# Patient Record
Sex: Female | Born: 1980 | Race: Black or African American | Hispanic: No | Smoking: Light tobacco smoker
Health system: Southern US, Community
[De-identification: ages and names within clinical notes are randomized; demographics above are authoritative.]

## PROBLEM LIST (undated history)

## (undated) DIAGNOSIS — Z789 Other specified health status: Secondary | ICD-10-CM

## (undated) HISTORY — PX: EYE SURGERY: SHX253

---

## 2000-10-25 ENCOUNTER — Ambulatory Visit (HOSPITAL_COMMUNITY): Admission: RE | Admit: 2000-10-25 | Discharge: 2000-10-25 | Payer: Self-pay | Admitting: Pulmonary Disease

## 2001-02-08 ENCOUNTER — Other Ambulatory Visit: Admission: RE | Admit: 2001-02-08 | Discharge: 2001-02-08 | Payer: Self-pay | Admitting: Obstetrics and Gynecology

## 2001-12-01 ENCOUNTER — Emergency Department (HOSPITAL_COMMUNITY): Admission: EM | Admit: 2001-12-01 | Discharge: 2001-12-01 | Payer: Self-pay | Admitting: Emergency Medicine

## 2003-02-10 ENCOUNTER — Emergency Department (HOSPITAL_COMMUNITY): Admission: EM | Admit: 2003-02-10 | Discharge: 2003-02-11 | Payer: Self-pay | Admitting: Emergency Medicine

## 2006-09-09 ENCOUNTER — Encounter: Admission: RE | Admit: 2006-09-09 | Discharge: 2006-09-09 | Payer: Self-pay | Admitting: Gastroenterology

## 2006-12-05 ENCOUNTER — Ambulatory Visit (HOSPITAL_COMMUNITY): Admission: RE | Admit: 2006-12-05 | Discharge: 2006-12-05 | Payer: Self-pay | Admitting: Preventative Medicine

## 2007-06-07 ENCOUNTER — Other Ambulatory Visit: Admission: RE | Admit: 2007-06-07 | Discharge: 2007-06-07 | Payer: Self-pay | Admitting: Obstetrics and Gynecology

## 2007-07-10 ENCOUNTER — Ambulatory Visit (HOSPITAL_COMMUNITY): Admission: RE | Admit: 2007-07-10 | Discharge: 2007-07-10 | Payer: Self-pay | Admitting: Obstetrics and Gynecology

## 2010-08-30 ENCOUNTER — Encounter: Payer: Self-pay | Admitting: Emergency Medicine

## 2010-08-30 ENCOUNTER — Emergency Department (HOSPITAL_COMMUNITY)
Admission: EM | Admit: 2010-08-30 | Discharge: 2010-08-30 | Disposition: A | Discharge status: Home / Self Care | Payer: Self-pay | Attending: Emergency Medicine | Admitting: Emergency Medicine

## 2010-08-30 DIAGNOSIS — R1013 Epigastric pain: Secondary | ICD-10-CM

## 2010-08-30 DIAGNOSIS — R1012 Left upper quadrant pain: Secondary | ICD-10-CM | POA: Insufficient documentation

## 2010-08-30 LAB — URINALYSIS, ROUTINE W REFLEX MICROSCOPIC
Bilirubin Urine: NEGATIVE
Ketones, ur: NEGATIVE mg/dL
Nitrite: NEGATIVE
Urobilinogen, UA: 0.2 mg/dL (ref 0.0–1.0)

## 2010-08-30 LAB — COMPREHENSIVE METABOLIC PANEL
ALT: 11 U/L (ref 0–35)
AST: 17 U/L (ref 0–37)
BUN: 9 mg/dL (ref 6–23)
Calcium: 9.8 mg/dL (ref 8.4–10.5)
Chloride: 102 mEq/L (ref 96–112)
Creatinine, Ser: 0.61 mg/dL (ref 0.50–1.10)
GFR calc Af Amer: >60 mL/min (ref >60)
GFR calc non Af Amer: >60 mL/min (ref >60)
Total Bilirubin: 0.7 mg/dL (ref 0.3–1.2)
Total Protein: 8.7 g/dL — ABNORMAL HIGH (ref 6.0–8.3)

## 2010-08-30 LAB — POCT PREGNANCY, URINE: Preg Test, Ur: NEGATIVE

## 2010-08-30 LAB — DIFFERENTIAL
Basophils Relative: 1 % (ref 0–1)
Neutro Abs: 2.2 10*3/uL (ref 1.7–7.7)

## 2010-08-30 LAB — CBC
HCT: 40 % (ref 36.0–46.0)
Hemoglobin: 13.7 g/dL (ref 12.0–15.0)
WBC: 4.9 10*3/uL (ref 4.0–10.5)

## 2010-08-30 LAB — LIPASE, BLOOD: Lipase: 28 U/L (ref 11–59)

## 2010-08-30 MED ORDER — SODIUM CHLORIDE 0.9 % IV SOLN
INTRAVENOUS | Status: DC
Start: 2010-08-30 — End: 2010-08-30
  Administered 2010-08-30: 15:00:00 via INTRAVENOUS

## 2010-08-30 MED ORDER — FAMOTIDINE IN NACL 20-0.9 MG/50ML-% IV SOLN
20.0000 mg | Freq: Once | INTRAVENOUS | Status: AC
  Administered 2010-08-30: 20 mg via INTRAVENOUS
  Filled 2010-08-30: qty 50

## 2010-08-30 MED ORDER — GI COCKTAIL ~~LOC~~
30.0000 mL | Freq: Once | ORAL | Status: AC
  Administered 2010-08-30: 30 mL via ORAL
  Filled 2010-08-30: qty 30

## 2010-08-30 NOTE — ED Notes (Signed)
Fluid challenge per MD order.

## 2010-08-30 NOTE — ED Provider Notes (Signed)
History     CSN: [id number] Arrival date & time: 08/30/2010  1:38 PM  Chief Complaint  Patient presents with  . Abdominal Pain   HPI  Pt was seen at 1400.  Per pt, c/o gradual onset and persistence of constant LUQ abd "pain" x4 days.  Has been assoc with decreased appetite and several loose stools per day.  Denies N/V/D, no rash, no fevers, no back pain, no dysuria, no black or blood in stools, no CP/SOB, no cough.    History reviewed. No pertinent past medical history.  History reviewed. No pertinent past surgical history.  History reviewed. No pertinent family history.  History  Substance Use Topics  . Smoking status: Not on file  . Smokeless tobacco: Not on file  . Alcohol Use: Not on file    OB History    Grav Para Term Preterm Abortions TAB SAB Ect Mult Living                  Review of Systems ROS: Statement: All systems negative except as marked or noted in the HPI; Constitutional: Negative for fever and chilld. ; ; Eyes: Negative for discharge and redness. ; ; ENMT: Negative for ear pain, epistaxis, hoarseness, nasal congestion, otorrhea, rhinorrhea and sore throat. ; ; Cardiovascular: Negative for diaphoresis, dyspnea and peripheral edema. ; ; Respiratory: Negative for cough, wheezing and stridor. ; ; Gastrointestinal: +LUQ abd pain, diarrhea, decreased appetite.  Negative for nausea, vomiting, blood in stool, hematemesis, jaundice and rectal bleeding. ; ; Genitourinary: Negative for dysuria and hematuria.  Negative for flank pain ; ; Musculoskeletal: Negative for stiffness, swelling and trauma. ; ; Skin: Negative for pruritus, rash, abrasions, blisters, bruising and skin lesion. ; ; Neuro: Negative for weakness, altered level of consciousness , altered mental status, extremity weakness, involuntary movement, muscle rigidity, neck stiffness, seizure and syncope.    Physical Exam  BP 119/90  Pulse 80  Temp(Src) 98.4 F (36.9 C) (Oral)  Resp 20  Ht 5\' 3"  (1.6 m)  Wt  108 lb (48.988 kg)  BMI 19.13 kg/m2  SpO2 100%  LMP 08/24/2010  Physical Exam 1405: Physical examination:  Nursing notes reviewed; Vital signs and O2 SAT reviewed;  Constitutional: Well developed, Well nourished, Well hydrated, In no acute distress; Head:  Normocephalic, atraumatic; Eyes: EOMI, PERRL, No scleral icterus; ENMT: Mouth and pharynx normal, Mucous membranes moist; Neck: Supple, Full range of motion, No lymphadenopathy; Cardiovascular: Regular rate and rhythm, No murmur, rub, or gallop; Respiratory: Breath sounds clear & equal bilaterally, No rales, rhonchi, wheezes, or rub, Normal respiratory effort/excursion; Chest: Nontender, Movement normal; Abdomen: Soft, +tender mid-epigastric and LUQ areas to palp,  No rebound or guarding. Nondistended, Normal bowel sounds; Extremities: Pulses normal, No tenderness, No edema, No calf edema or asymmetry.; Neuro: AA&Ox3, Major CN grossly intact.  No gross focal motor or sensory deficits in extremities.; Skin: Color normal, Warm, Dry.   ED Course  Procedures  MDM MDM Reviewed: nursing note and vitals Interpretation: labs   Results for orders placed during the hospital encounter of 08/30/10  CBC      Component Value Range   WBC 4.9  4.0 - 10.5 (K/uL)   RBC 4.27  3.87 - 5.11 (MIL/uL)   Hemoglobin 13.7  12.0 - 15.0 (g/dL)   HCT 40.9  81.1 - 91.4 (%)   MCV 93.7  78.0 - 100.0 (fL)   MCH 32.1  26.0 - 34.0 (pg)   MCHC 34.3  30.0 - 36.0 (g/dL)  RDW 12.9  11.5 - 15.5 (%)   Platelets 245  150 - 400 (K/uL)  DIFFERENTIAL      Component Value Range   Neutrophils Relative 44  43 - 77 (%)   Neutro Abs 2.2  1.7 - 7.7 (K/uL)   Lymphocytes Relative 45  12 - 46 (%)   Lymphs Abs 2.2  0.7 - 4.0 (K/uL)   Monocytes Relative 8  3 - 12 (%)   Monocytes Absolute 0.4  0.1 - 1.0 (K/uL)   Eosinophils Relative 2  0 - 5 (%)   Eosinophils Absolute 0.1  0.0 - 0.7 (K/uL)   Basophils Relative 1  0 - 1 (%)   Basophils Absolute 0.0  0.0 - 0.1 (K/uL)    COMPREHENSIVE METABOLIC PANEL      Component Value Range   Sodium 138  135 - 145 (mEq/L)   Potassium 3.5  3.5 - 5.1 (mEq/L)   Chloride 102  96 - 112 (mEq/L)   CO2 22  19 - 32 (mEq/L)   Glucose, Bld 80  70 - 99 (mg/dL)   BUN 9  6 - 23 (mg/dL)   Creatinine, Ser 1.61  0.50 - 1.10 (mg/dL)   Calcium 9.8  8.4 - 09.6 (mg/dL)   Total Protein 8.7 (*) 6.0 - 8.3 (g/dL)   Albumin 4.6  3.5 - 5.2 (g/dL)   AST 17  0 - 37 (U/L)   ALT 11  0 - 35 (U/L)   Alkaline Phosphatase 58  39 - 117 (U/L)   Total Bilirubin 0.7  0.3 - 1.2 (mg/dL)   GFR calc non Af Amer >60  >60 (mL/min)   GFR calc Af Amer >60  >60 (mL/min)  LIPASE, BLOOD      Component Value Range   Lipase 28  11 - 59 (U/L)  URINALYSIS, ROUTINE W REFLEX MICROSCOPIC      Component Value Range   Color, Urine YELLOW  YELLOW    Appearance CLEAR  CLEAR    Specific Gravity, Urine 1.020  1.005 - 1.030    pH 6.0  5.0 - 8.0    Glucose, UA NEGATIVE  NEGATIVE (mg/dL)   Hgb urine dipstick NEGATIVE  NEGATIVE    Bilirubin Urine NEGATIVE  NEGATIVE    Ketones, ur NEGATIVE  NEGATIVE (mg/dL)   Protein, ur NEGATIVE  NEGATIVE (mg/dL)   Urobilinogen, UA 0.2  0.0 - 1.0 (mg/dL)   Nitrite NEGATIVE  NEGATIVE    Leukocytes, UA NEGATIVE  NEGATIVE   POCT PREGNANCY, URINE      Component Value Range   Preg Test, Ur NEGATIVE      4:17 PM:  Pt states she feels "better" after meds and wants to go home now.  Has tol PO well in ED without N/V/D.  WBC, LFT's, lipase normal.  Will have pt watch diet, take PO pepcid/maalox, f/u PMD.  Dx testing d/w pt and family.  Questions answered.  Verb understanding, agreeable to d/c home with outpt f/u.   Araiyah Cumpton Allison Quarry, DO 09/01/10 1612

## 2010-08-30 NOTE — ED Notes (Signed)
Pt complaining of abd pain since last Thursday. Reports diarrhea. Denies n/v.

## 2018-02-20 ENCOUNTER — Encounter: Payer: Self-pay | Admitting: Women's Health

## 2018-02-20 ENCOUNTER — Ambulatory Visit (INDEPENDENT_AMBULATORY_CARE_PROVIDER_SITE_OTHER): Payer: BC Managed Care – PPO | Admitting: Women's Health

## 2018-02-20 ENCOUNTER — Other Ambulatory Visit (HOSPITAL_COMMUNITY)
Admission: RE | Admit: 2018-02-20 | Discharge: 2018-02-20 | Disposition: A | Discharge status: Home / Self Care | Payer: BC Managed Care – PPO | Source: Ambulatory Visit | Attending: Obstetrics & Gynecology | Admitting: Obstetrics & Gynecology

## 2018-02-20 VITALS — BP 135/85 | HR 79 | Ht 63.0 in | Wt 119.0 lb

## 2018-02-20 DIAGNOSIS — N946 Dysmenorrhea, unspecified: Secondary | ICD-10-CM

## 2018-02-20 DIAGNOSIS — N920 Excessive and frequent menstruation with regular cycle: Secondary | ICD-10-CM | POA: Insufficient documentation

## 2018-02-20 DIAGNOSIS — Z01419 Encounter for gynecological examination (general) (routine) without abnormal findings: Secondary | ICD-10-CM | POA: Insufficient documentation

## 2018-02-20 NOTE — Patient Instructions (Addendum)
Check your blood pressure 4 times daily and keep a log, send me a picture on mychart in 1 week  Primary Care Providers  Dr. Dwana Melena Russell) [phone]  [facility] [phone]  [facility]) Dr. Karilyn Cota [phone]  [facility]) [phone]  [facility]) [phone]  [facility] [phone]

## 2018-02-20 NOTE — Progress Notes (Signed)
WELL-WOMAN EXAMINATION Patient name: Colleen Marshall MRN [id number]  Date of birth: 25-Mar-1980 Chief Complaint:   Gynecologic Exam  History of Present Illness:   Colleen Marshall is a 38 y.o. G0P0000 African American female being seen today for a routine well-woman exam.  Current complaints: heavy periods. Regular, unless has plans- then will start the day she has plans-always. Heavy for 1st 4 days, then 'manageable'. Wears pads only, changes 4-5x/day, bleeds onto clothes. Has to lay towel in bed, b/c bleeds through at night. Bad cramping. Quarter sized clots. Interested in IUD. Widow- husband killed 1 year ago. Not currently interested in group therapy, doesn't like to talk about it. Has good support system.  BP high-normal, mom/dad/older sister all have HTN.  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 The current method of family planning is abstinence Last pap >5175yrs ago w/ previous PCP. Results were: normal, never had abnormal Last mammogram: never. Results were: n/a. Family h/o breast cancer: No Last colonoscopy: never. Results were: n/a. Family h/o colorectal cancer: No Review of Systems:   Pertinent items are noted in HPI Denies any headaches, blurred vision, fatigue, shortness of breath, chest pain, abdominal pain, abnormal vaginal discharge/itching/odor/irritation, problems with periods, bowel movements, urination, or intercourse unless otherwise stated above. Pertinent History Reviewed:  Reviewed past medical,surgical, social and family history.  Reviewed problem list, medications and allergies. Physical Assessment:   Vitals:   02/20/18 0948 02/20/18 1032  BP: 139/89 135/85  Pulse: 84 79  Weight: 119 lb (54 kg)   Height: 5\' 3"  (1.6 m)   Body mass index is 21.08 kg/m.        Physical Examination:   General appearance - well appearing, and in no distress  Mental  status - alert, oriented to person, place, and time  Psych:  She has a normal mood and affect  Skin - warm and dry, normal color, no suspicious lesions noted  Chest - effort normal, all lung fields clear to auscultation bilaterally  Heart - normal rate and regular rhythm  Neck:  midline trachea, no thyromegaly or nodules  Breasts - breasts appear normal, no suspicious masses, no skin or nipple changes or  axillary nodes  Abdomen - soft, nontender, nondistended, no masses or organomegaly  Pelvic - VULVA: normal appearing vulva with no masses, tenderness or lesions  VAGINA: normal appearing vagina with normal color and discharge, no lesions  CERVIX: normal appearing cervix without discharge or lesions, no CMT  Thin prep pap is done w/ HR HPV cotesting  UTERUS: uterus is felt to be normal size, shape, consistency and nontender   ADNEXA: No adnexal masses or tenderness noted.  Extremities:  No swelling or varicosities noted  No results found for this or any previous visit (from the past 24 hour(s)).  Assessment & Plan:  1) Well-Woman Exam  2) Menorrhagia w/ dysmenorrhea> check cbc, tsh, gc/ct today-will schedule pelvic u/s after next period, then plan IUD  3) High-normal bp> strong family h/o HTN, start checking bp's QID at home, send me pic via mychart in 1wk  Labs/procedures today: pap, gc/ct, cbc, tsh  Mammogram @40yo  or sooner if problems Colonoscopy @45 -50yo or sooner if problems  Orders Placed This Encounter  Procedures  . GC/Chlamydia Probe Amp  . US PELVIS (TRANSABDOMINAL ONLY)  . US PELVIS TRANSVANGINAL NON-OB (TV ONLY)  . CBC  . TSH  Follow-up: Return for 2/24 for pelvic u/s and f/u w/ me after.  Cheral MarkerKimberly R Vickey Boak CNM, [facility]NP-BC 02/20/2018 11:30 AM

## 2018-02-21 LAB — CBC
Hematocrit: 38.4 % (ref 34.0–46.6)
Hemoglobin: 12.9 g/dL (ref 11.1–15.9)
MCH: 32.1 pg (ref 26.6–33.0)
MCHC: 33.6 g/dL (ref 31.5–35.7)
MCV: 96 fL (ref 79–97)
PLATELETS: 220 10*3/uL (ref 150–450)
RBC: 4.02 x10E6/uL (ref 3.77–5.28)
RDW: 12.8 % (ref 11.7–15.4)
WBC: 6 10*3/uL (ref 3.4–10.8)

## 2018-02-21 LAB — CYTOLOGY - PAP
DIAGNOSIS: NEGATIVE
HPV: NOT DETECTED

## 2018-02-21 LAB — TSH: TSH: 1.39 u[IU]/mL (ref 0.450–4.500)

## 2018-02-23 LAB — GC/CHLAMYDIA PROBE AMP
Chlamydia trachomatis, NAA: NEGATIVE
NEISSERIA GONORRHOEAE BY PCR: NEGATIVE

## 2018-03-14 ENCOUNTER — Ambulatory Visit (INDEPENDENT_AMBULATORY_CARE_PROVIDER_SITE_OTHER): Payer: BC Managed Care – PPO

## 2018-03-14 ENCOUNTER — Telehealth: Payer: Self-pay | Admitting: Women's Health

## 2018-03-14 ENCOUNTER — Ambulatory Visit: Payer: BC Managed Care – PPO | Admitting: Women's Health

## 2018-03-14 DIAGNOSIS — N920 Excessive and frequent menstruation with regular cycle: Secondary | ICD-10-CM

## 2018-03-14 DIAGNOSIS — N946 Dysmenorrhea, unspecified: Secondary | ICD-10-CM

## 2018-03-14 MED ORDER — MISOPROSTOL 200 MCG PO TABS: 400.0000 ug | ORAL_TABLET | Freq: Once | ORAL | 0 refills | Status: DC

## 2018-03-14 NOTE — Progress Notes (Signed)
PELVIC US TA/TV:homogeneous anteverted uterus,wnl,EEC 10.8 mm,normal ovaries bilat,ovaries appear mobile,no pain during ultrasound,no free fluid

## 2018-03-14 NOTE — Telephone Encounter (Signed)
Notified pt of normal pelvic u/s today. Wants to proceed w/ IUD insertion. Not sexually active. Wants appt asap, is tired of dealing w/ heavy periods. Rx cytotec po, take 2hrs before appt, switched to front to schedule IUD insertion.  Cheral Marker, CNM, [facility] 03/14/2018 2:29 PM

## 2018-03-27 ENCOUNTER — Ambulatory Visit: Payer: BC Managed Care – PPO | Admitting: Women's Health

## 2018-03-27 ENCOUNTER — Encounter: Payer: Self-pay | Admitting: Women's Health

## 2018-03-27 VITALS — BP 123/84 | HR 84 | Ht 63.0 in | Wt 119.6 lb

## 2018-03-27 DIAGNOSIS — Z30430 Encounter for insertion of intrauterine contraceptive device: Secondary | ICD-10-CM | POA: Diagnosis not present

## 2018-03-27 DIAGNOSIS — Z3202 Encounter for pregnancy test, result negative: Secondary | ICD-10-CM

## 2018-03-27 MED ORDER — LEVONORGESTREL 19.5 MCG/DAY IU IUD
INTRAUTERINE_SYSTEM | Freq: Once | INTRAUTERINE | Status: AC
  Administered 2018-03-27: 15:00:00 via INTRAUTERINE

## 2018-03-27 NOTE — Patient Instructions (Signed)
 Nothing in vagina for 3 days (no sex, douching, tampons, etc...)  Check your strings once a month to make sure you can feel them, if you are not able to please let us know  If you develop a fever of 100.4 or more in the next few weeks, or if you develop severe abdominal pain, please let us know  Use a backup method of birth control, such as condoms, for 2 weeks    Intrauterine Device Insertion, Care After  This sheet gives you information about how to care for yourself after your procedure. Your health care provider may also give you more specific instructions. If you have problems or questions, contact your health care provider. What can I expect after the procedure? After the procedure, it is common to have:  Cramps and pain in the abdomen.  Light bleeding (spotting) or heavier bleeding that is like your menstrual period. This may last for up to a few days.  Lower back pain.  Dizziness.  Headaches.  Nausea. Follow these instructions at home:  Before resuming sexual activity, check to make sure that you can feel the IUD string(s). You should be able to feel the end of the string(s) below the opening of your cervix. If your IUD string is in place, you may resume sexual activity. ? If you had a hormonal IUD inserted more than 7 days after your most recent period started, you will need to use a backup method of birth control for 7 days after IUD insertion. Ask your health care provider whether this applies to you.  Continue to check that the IUD is still in place by feeling for the string(s) after every menstrual period, or once a month.  Take over-the-counter and prescription medicines only as told by your health care provider.  Do not drive or use heavy machinery while taking prescription pain medicine.  Keep all follow-up visits as told by your health care provider. This is important. Contact a health care provider if:  You have bleeding that is heavier or lasts longer than  a normal menstrual cycle.  You have a fever.  You have cramps or abdominal pain that get worse or do not get better with medicine.  You develop abdominal pain that is new or is not in the same area of earlier cramping and pain.  You feel lightheaded or weak.  You have abnormal or bad-smelling discharge from your vagina.  You have pain during sexual activity.  You have any of the following problems with your IUD string(s): ? The string bothers or hurts you or your sexual partner. ? You cannot feel the string. ? The string has gotten longer.  You can feel the IUD in your vagina.  You think you may be pregnant, or you miss your menstrual period.  You think you may have an STI (sexually transmitted infection). Get help right away if:  You have flu-like symptoms.  You have a fever and chills.  You can feel that your IUD has slipped out of place. Summary  After the procedure, it is common to have cramps and pain in the abdomen. It is also common to have light bleeding (spotting) or heavier bleeding that is like your menstrual period.  Continue to check that the IUD is still in place by feeling for the string(s) after every menstrual period, or once a month.  Keep all follow-up visits as told by your health care provider. This is important.  Contact your health care provider if   you have problems with your IUD string(s), such as the string getting longer or bothering you or your sexual partner. This information is not intended to replace advice given to you by your health care provider. Make sure you discuss any questions you have with your health care provider. Document Released: 09/02/2010 Document Revised: 11/26/2015 Document Reviewed: 11/26/2015 Elsevier Interactive Patient Education  2019 Elsevier Inc.  Levonorgestrel intrauterine device (IUD) What is this medicine? LEVONORGESTREL IUD (LEE voe nor jes trel) is a contraceptive (birth control) device. The device is placed  inside the uterus by a healthcare professional. It is used to prevent pregnancy. This device can also be used to treat heavy bleeding that occurs during your period. This medicine may be used for other purposes; ask your health care provider or pharmacist if you have questions. COMMON BRAND NAME(S): Kyleena, LILETTA, Mirena, Skyla What should I tell my health care provider before I take this medicine? They need to know if you have any of these conditions: -abnormal Pap smear -cancer of the breast, uterus, or cervix -diabetes -endometritis -genital or pelvic infection now or in the past -have more than one sexual partner or your partner has more than one partner -heart disease -history of an ectopic or tubal pregnancy -immune system problems -IUD in place -liver disease or tumor -problems with blood clots or take blood-thinners -seizures -use intravenous drugs -uterus of unusual shape -vaginal bleeding that has not been explained -an unusual or allergic reaction to levonorgestrel, other hormones, silicone, or polyethylene, medicines, foods, dyes, or preservatives -pregnant or trying to get pregnant -breast-feeding How should I use this medicine? This device is placed inside the uterus by a health care professional. Talk to your pediatrician regarding the use of this medicine in children. Special care may be needed. Overdosage: If you think you have taken too much of this medicine contact a poison control center or emergency room at once. NOTE: This medicine is only for you. Do not share this medicine with others. What if I miss a dose? This does not apply. Depending on the brand of device you have inserted, the device will need to be replaced every 3 to 5 years if you wish to continue using this type of birth control. What may interact with this medicine? Do not take this medicine with any of the following medications: -amprenavir -bosentan -fosamprenavir This medicine may also  interact with the following medications: -aprepitant -armodafinil -barbiturate medicines for inducing sleep or treating seizures -bexarotene -boceprevir -griseofulvin -medicines to treat seizures like carbamazepine, ethotoin, felbamate, oxcarbazepine, phenytoin, topiramate -modafinil -pioglitazone -rifabutin -rifampin -rifapentine -some medicines to treat HIV infection like atazanavir, efavirenz, indinavir, lopinavir, nelfinavir, tipranavir, ritonavir -St. John's wort -warfarin This list may not describe all possible interactions. Give your health care provider a list of all the medicines, herbs, non-prescription drugs, or dietary supplements you use. Also tell them if you smoke, drink alcohol, or use illegal drugs. Some items may interact with your medicine. What should I watch for while using this medicine? Visit your doctor or health care professional for regular check ups. See your doctor if you or your partner has sexual contact with others, becomes HIV positive, or gets a sexual transmitted disease. This product does not protect you against HIV infection (AIDS) or other sexually transmitted diseases. You can check the placement of the IUD yourself by reaching up to the top of your vagina with clean fingers to feel the threads. Do not pull on the threads. It is a good habit   to check placement after each menstrual period. Call your doctor right away if you feel more of the IUD than just the threads or if you cannot feel the threads at all. The IUD may come out by itself. You may become pregnant if the device comes out. If you notice that the IUD has come out use a backup birth control method like condoms and call your health care provider. Using tampons will not change the position of the IUD and are okay to use during your period. This IUD can be safely scanned with magnetic resonance imaging (MRI) only under specific conditions. Before you have an MRI, tell your healthcare provider that  you have an IUD in place, and which type of IUD you have in place. What side effects may I notice from receiving this medicine? Side effects that you should report to your doctor or health care professional as soon as possible: -allergic reactions like skin rash, itching or hives, swelling of the face, lips, or tongue -fever, flu-like symptoms -genital sores -high blood pressure -no menstrual period for 6 weeks during use -pain, swelling, warmth in the leg -pelvic pain or tenderness -severe or sudden headache -signs of pregnancy -stomach cramping -sudden shortness of breath -trouble with balance, talking, or walking -unusual vaginal bleeding, discharge -yellowing of the eyes or skin Side effects that usually do not require medical attention (report to your doctor or health care professional if they continue or are bothersome): -acne -breast pain -change in sex drive or performance -changes in weight -cramping, dizziness, or faintness while the device is being inserted -headache -irregular menstrual bleeding within first 3 to 6 months of use -nausea This list may not describe all possible side effects. Call your doctor for medical advice about side effects. You may report side effects to FDA at 1-800-FDA-1088. Where should I keep my medicine? This does not apply. NOTE: This sheet is a summary. It may not cover all possible information. If you have questions about this medicine, talk to your doctor, pharmacist, or health care provider.  2019 Elsevier/Gold Standard (2015-10-17 14:14:56)  

## 2018-03-27 NOTE — Progress Notes (Signed)
   IUD INSERTION Patient name: Colleen Marshall MRN [id number]  Date of birth: 05/20/1980 Subjective Findings:   [name] is a 38 y.o. G0P0000 African American female being seen today for insertion of a Liletta IUD d/t menorrhagia w/ mostly regular periods, and dysmenorrhea. Had normal pelvic u/s 03/14/18. Pap, TSH, gc/ct all neg 02/20/18.  Took cytotec few hours ago, some cramping.   Patient's last menstrual period was 03/02/2018. Last sexual intercourse was >2wks ago, currently not sexually active Last pap 02/20/18. Results were:  neg w/ -HRHPV  The risks and benefits of the method and placement have been thouroughly reviewed with the patient and all questions were answered.  Specifically the patient is aware of failure rate of 01/998, expulsion of the IUD and of possible perforation.  The patient is aware of irregular bleeding due to the method and understands the incidence of irregular bleeding diminishes with time.  Signed copy of informed consent in chart.  Pertinent History Reviewed:   Reviewed past medical,surgical, social, obstetrical and family history.  Reviewed problem list, medications and allergies. Objective Findings & Procedure:   Vitals:   03/27/18 1415  BP: 123/84  Pulse: 84  Weight: 119 lb 9.6 oz (54.3 kg)  Height: 5\' 3"  (1.6 m)  Body mass index is 21.19 kg/m.  No results found for this or any previous visit (from the past 24 hour(s)).   Time out was performed.  A graves speculum was placed in the vagina.  The cervix was visualized, prepped using Betadine, and grasped with a single tooth tenaculum. The uterus was found to be anteroflexed and it sounded to 7 cm.  Liletta IUD placed per manufacturer's recommendations. The strings were trimmed to approximately 3 cm. The patient tolerated the procedure well, although very crampy.  Informal transvaginal sonogram was performed and the proper placement of the IUD was verified. Assessment & Plan:   1) Liletta IUD  insertion The patient was given post procedure instructions, including signs and symptoms of infection and to check for the strings after each menses or each month, and refraining from intercourse or anything in the vagina for 3 days. She was given a Liletta care card with date IUD placed, and date IUD to be removed. She is scheduled for a f/u appointment in 4 weeks.  Orders Placed This Encounter  Procedures  . POCT urine pregnancy    Return in about 4 weeks (around 04/24/2018) for F/U.  Cheral Marker CNM, [facility] 03/27/2018 3:09 PM

## 2018-03-28 ENCOUNTER — Other Ambulatory Visit: Payer: Self-pay | Admitting: Women's Health

## 2018-03-28 MED ORDER — PROMETHAZINE HCL 25 MG PO TABS: 12.5000 mg | ORAL_TABLET | Freq: Four times a day (QID) | ORAL | 0 refills | Status: DC | PRN

## 2018-03-28 NOTE — Progress Notes (Signed)
Called pt to check on her, as she was having bad cramps after IUD insertion yesterday, she states cramps got much worse last night, took tylenol which helped, cramps better today, but nauseated. Offered phenergan rx, which she wants, so rx sent. Keep f/u as scheduled, let me know if anything worsens.  Cheral Marker, CNM, [facility] 03/28/2018 1:44 PM

## 2018-03-31 ENCOUNTER — Telehealth: Payer: Self-pay | Admitting: Women's Health

## 2018-03-31 MED ORDER — PROMETHAZINE HCL 25 MG PO TABS: 12.5000 mg | ORAL_TABLET | Freq: Four times a day (QID) | ORAL | 0 refills | Status: DC | PRN

## 2018-03-31 NOTE — Telephone Encounter (Signed)
Please call pt having cramping and nauesa had IUD placement on Monday/ [facility] is not a [employer] employee

## 2018-03-31 NOTE — Addendum Note (Signed)
Addended by: Sherre Lain A on: 03/31/2018 01:35 PM   Modules accepted: Orders

## 2018-04-03 ENCOUNTER — Other Ambulatory Visit: Payer: Self-pay | Admitting: Women's Health

## 2018-04-03 ENCOUNTER — Telehealth: Payer: Self-pay | Admitting: Women's Health

## 2018-04-03 MED ORDER — TRAMADOL HCL 50 MG PO TABS: 50.0000 mg | ORAL_TABLET | Freq: Two times a day (BID) | ORAL | 0 refills | Status: DC | PRN

## 2018-04-03 MED ORDER — MEGESTROL ACETATE 40 MG PO TABS: ORAL_TABLET | ORAL | 1 refills | Status: DC

## 2018-04-03 NOTE — Telephone Encounter (Signed)
Needs for Colleen Marshall to give her a call today with situation that happened this weeked.  Please call pt

## 2018-04-03 NOTE — Telephone Encounter (Signed)
Returned pt's call. Was unable to get phenergan for nausea, it was sent to pharmacy in [place]. Nausea improved. Period started and is very heavy, cramps have increased. Will rx megace to try to help stop bleeding. Pt reports ibuprofen/apap/nsaids make bleeding heavier, so can't take for cramps. Has been taking asa daily, which she feels has made it worse. Feels she definitely needs something for cramps. Can't do toradol as it's an NSAID. Will rx tramadol #15. Cheral Marker, CNM, WHNP-BC 04/03/2018 11:10 AM

## 2018-04-20 ENCOUNTER — Encounter: Payer: Self-pay | Admitting: *Deleted

## 2018-04-24 ENCOUNTER — Ambulatory Visit: Payer: BC Managed Care – PPO | Admitting: Women's Health

## 2018-04-24 ENCOUNTER — Other Ambulatory Visit: Payer: Self-pay

## 2018-05-01 ENCOUNTER — Other Ambulatory Visit: Payer: Self-pay | Admitting: Women's Health

## 2018-05-01 MED ORDER — TRAMADOL HCL 50 MG PO TABS: 50.0000 mg | ORAL_TABLET | Freq: Two times a day (BID) | ORAL | 0 refills | Status: DC | PRN

## 2018-05-05 ENCOUNTER — Other Ambulatory Visit: Payer: Self-pay | Admitting: Women's Health

## 2018-05-19 ENCOUNTER — Encounter: Payer: Self-pay | Admitting: *Deleted

## 2018-05-22 ENCOUNTER — Ambulatory Visit (INDEPENDENT_AMBULATORY_CARE_PROVIDER_SITE_OTHER): Payer: BC Managed Care – PPO | Admitting: Advanced Practice Midwife

## 2018-05-22 ENCOUNTER — Other Ambulatory Visit: Payer: Self-pay

## 2018-05-22 ENCOUNTER — Encounter: Payer: Self-pay | Admitting: Advanced Practice Midwife

## 2018-05-22 VITALS — BP 137/88 | HR 81 | Ht 63.0 in | Wt 121.5 lb

## 2018-05-22 DIAGNOSIS — Z30431 Encounter for routine checking of intrauterine contraceptive device: Secondary | ICD-10-CM

## 2018-05-22 MED ORDER — MEGESTROL ACETATE 40 MG PO TABS: ORAL_TABLET | ORAL | 2 refills | Status: DC

## 2018-05-22 NOTE — Progress Notes (Signed)
History:  38 y.o. G0. A lyletta IUD was placed  03/27/18 to help w/bleeding.  She is rather annoyed at the SE of bleeding, has been taking megace.  Megace slows it down, but hasn't completely stopped.    The following portions of the patient's history were reviewed and updated as appropriate: allergies, current medications, past family history, past medical history, past social history, past surgical history and problem list.  Review of Systems:   Constitutional: Negative for fever and chills Eyes: Negative for visual disturbances Respiratory: Negative for shortness of breath, dyspnea Cardiovascular: Negative for chest pain or palpitations  Gastrointestinal: Negative for vomiting, diarrhea and constipation Genitourinary: Negative for dysuria and urgency Musculoskeletal: Negative for back pain, joint pain, myalgias  Neurological: Negative for dizziness and headaches    Objective:  Physical Exam Blood pressure 137/88, pulse 81, height 5\' 3"  (1.6 m), weight 121 lb 8 oz (55.1 kg). Gen: NAD Abd: Soft, nontender and nondistended Pelvic: Bedside US reveals properly place IUD.   Assessment & Plan:  Normal IUD check. BTB SE bothersome to pt:  Take megace 120mg /day until bleeding has stopped for 5-7days, then try to taper down to 40mg /day.  If this isn't possible, it is OK to continue to take 80-120mg /day if needed.  Stay on meds for at least the next 2 months.   If bleeding doesn't stop in one week, call for advice (will make dosage decisions based on what bleeding pattern is like, hopeful that megace will work). If not, could consider estrogen instead.

## 2018-05-23 ENCOUNTER — Telehealth: Payer: Self-pay | Admitting: Women's Health

## 2018-05-23 NOTE — Telephone Encounter (Signed)
Patient was seen yesterday by Drenda Freeze, per the patient, she increased her medication and the pharmacy will not fill it.  [facility]  [phone]

## 2018-05-23 NOTE — Telephone Encounter (Signed)
Spoke to pharmacy who states insurance will not pay for refill until 5/10.  Patient states she does not think she enough to take 3 daily until then so she will cut back to 1 daily until then.

## 2018-06-29 ENCOUNTER — Other Ambulatory Visit: Payer: Self-pay | Admitting: Women's Health

## 2018-06-29 MED ORDER — MEGESTROL ACETATE 40 MG PO TABS: 40.0000 mg | ORAL_TABLET | Freq: Three times a day (TID) | ORAL | 1 refills | Status: DC

## 2018-08-17 ENCOUNTER — Other Ambulatory Visit: Payer: Self-pay

## 2018-08-17 DIAGNOSIS — Z20822 Contact with and (suspected) exposure to covid-19: Secondary | ICD-10-CM

## 2018-08-20 LAB — NOVEL CORONAVIRUS, NAA: SARS-CoV-2, NAA: NOT DETECTED

## 2019-03-25 ENCOUNTER — Ambulatory Visit: Payer: BC Managed Care – PPO | Attending: Internal Medicine

## 2019-03-25 DIAGNOSIS — Z23 Encounter for immunization: Secondary | ICD-10-CM | POA: Insufficient documentation

## 2019-03-25 NOTE — Progress Notes (Signed)
   Covid-19 Vaccination Clinic  Name:  Colleen Marshall    MRN: [id number] DOB: 11-Jan-1981  03/25/2019  [name] was observed post Covid-19 immunization for 15 minutes without incident. She was provided with Vaccine Information Sheet and instruction to access the V-Safe system.   [name] was instructed to call 911 with any severe reactions post vaccine: Marland Kitchen Difficulty breathing  . Swelling of face and throat  . A fast heartbeat  . A bad rash all over body  . Dizziness and weakness   Immunizations Administered    Name Date Dose VIS Date Route   Pfizer COVID-19 Vaccine 03/25/2019  1:54 PM 0.3 mL 12/29/2018 Intramuscular   Manufacturer: [employer], [employer]   Lot: [id number]   NDC: [address]-7

## 2019-04-15 ENCOUNTER — Ambulatory Visit: Payer: BC Managed Care – PPO | Attending: Internal Medicine

## 2019-04-15 ENCOUNTER — Other Ambulatory Visit: Payer: Self-pay

## 2019-04-15 DIAGNOSIS — Z23 Encounter for immunization: Secondary | ICD-10-CM

## 2019-04-15 NOTE — Progress Notes (Signed)
   Covid-19 Vaccination Clinic  Name:  Colleen Marshall    MRN: [id number] DOB: Dec 26, 1980  04/15/2019  [name] was observed post Covid-19 immunization for 15 minutes without incident. She was provided with Vaccine Information Sheet and instruction to access the V-Safe system.   [name] was instructed to call 911 with any severe reactions post vaccine: Marland Kitchen Difficulty breathing  . Swelling of face and throat  . A fast heartbeat  . A bad rash all over body  . Dizziness and weakness   Immunizations Administered    Name Date Dose VIS Date Route   Pfizer COVID-19 Vaccine 04/15/2019 11:49 AM 0.3 mL 12/29/2018 Intramuscular   Manufacturer: [employer], [employer]   Lot: [id number]   NDC: [address]-6

## 2020-03-10 ENCOUNTER — Ambulatory Visit: Payer: BC Managed Care – PPO | Admitting: Advanced Practice Midwife

## 2020-03-10 ENCOUNTER — Other Ambulatory Visit (HOSPITAL_COMMUNITY)
Admission: RE | Admit: 2020-03-10 | Discharge: 2020-03-10 | Disposition: A | Discharge status: Home / Self Care | Payer: BC Managed Care – PPO | Source: Ambulatory Visit | Attending: Advanced Practice Midwife | Admitting: Advanced Practice Midwife

## 2020-03-10 ENCOUNTER — Other Ambulatory Visit: Payer: Self-pay

## 2020-03-10 VITALS — BP 143/86 | HR 84 | Wt 122.0 lb

## 2020-03-10 DIAGNOSIS — N921 Excessive and frequent menstruation with irregular cycle: Secondary | ICD-10-CM | POA: Diagnosis not present

## 2020-03-10 DIAGNOSIS — Z975 Presence of (intrauterine) contraceptive device: Secondary | ICD-10-CM

## 2020-03-10 DIAGNOSIS — N939 Abnormal uterine and vaginal bleeding, unspecified: Secondary | ICD-10-CM | POA: Insufficient documentation

## 2020-03-10 DIAGNOSIS — Z30431 Encounter for routine checking of intrauterine contraceptive device: Secondary | ICD-10-CM | POA: Diagnosis not present

## 2020-03-10 MED ORDER — MEGESTROL ACETATE 40 MG PO TABS: ORAL_TABLET | ORAL | 11 refills | Status: DC

## 2020-03-10 NOTE — Progress Notes (Signed)
History:  40 y.o. G0P0000 here today for today for IUD string check; Liletta IUD was placed  2 years ago  Still bleeds irregularly, megace works while she is taking it. Starting a new relationship, doesn't want to bleed during ntercourse. IUD helps w/menorrhagia, which is why she got it.  .  The following portions of the patient's history were reviewed and updated as appropriate: allergies, current medications, past family history, past medical history, past social history, past surgical history and problem list.  Review of Systems:   Constitutional: Negative for fever and chills Eyes: Negative for visual disturbances Respiratory: Negative for shortness of breath, dyspnea Cardiovascular: Negative for chest pain or palpitations  Gastrointestinal: Negative for vomiting, diarrhea and constipation Genitourinary: Negative for dysuria and urgency Musculoskeletal: Negative for back pain, joint pain, myalgias  Neurological: Negative for dizziness and headaches    Objective:  Physical Exam Blood pressure (!) 143/86, pulse 84, weight 122 lb (55.3 kg), last menstrual period 02/18/2020. Gen: NAD Abd: Soft, nontender and nondistended Pelvic: Bedside US reveals properly place IUD. Normal appearing d/c w/o odor, nuswab collected. No sex for >3 years, but wants std screening d/t getting ready to start a new relationship   Assessment & Plan:  BTB d/t Liletta Options:  DC IUD, do endo ablation or other form of BC for dysmenorrhea Change IUD to Paragard, add lysteda for pain Daily megace.   Chooses daily megace for now.

## 2020-03-12 LAB — CERVICOVAGINAL ANCILLARY ONLY
Bacterial Vaginitis (gardnerella): POSITIVE — AB
Candida Glabrata: NEGATIVE
Candida Vaginitis: NEGATIVE
Chlamydia: NEGATIVE
Comment: NEGATIVE
Comment: NEGATIVE
Comment: NEGATIVE
Comment: NEGATIVE
Comment: NEGATIVE
Comment: NORMAL
Neisseria Gonorrhea: NEGATIVE
Trichomonas: NEGATIVE

## 2020-03-13 ENCOUNTER — Other Ambulatory Visit: Payer: Self-pay | Admitting: Advanced Practice Midwife

## 2020-03-13 MED ORDER — METRONIDAZOLE 500 MG PO TABS: 500.0000 mg | ORAL_TABLET | Freq: Two times a day (BID) | ORAL | 0 refills | Status: DC

## 2020-03-13 NOTE — Progress Notes (Signed)
Flagyl for BV 

## 2021-03-16 ENCOUNTER — Ambulatory Visit (INDEPENDENT_AMBULATORY_CARE_PROVIDER_SITE_OTHER): Payer: BC Managed Care – PPO | Admitting: Women's Health

## 2021-03-16 ENCOUNTER — Encounter: Payer: Self-pay | Admitting: Women's Health

## 2021-03-16 ENCOUNTER — Other Ambulatory Visit (HOSPITAL_COMMUNITY)
Admission: RE | Admit: 2021-03-16 | Discharge: 2021-03-16 | Disposition: A | Discharge status: Home / Self Care | Payer: BC Managed Care – PPO | Source: Ambulatory Visit | Attending: Women's Health | Admitting: Women's Health

## 2021-03-16 ENCOUNTER — Other Ambulatory Visit: Payer: Self-pay

## 2021-03-16 VITALS — BP 137/87 | HR 77 | Ht 63.0 in | Wt 118.5 lb

## 2021-03-16 DIAGNOSIS — N926 Irregular menstruation, unspecified: Secondary | ICD-10-CM

## 2021-03-16 DIAGNOSIS — Z1231 Encounter for screening mammogram for malignant neoplasm of breast: Secondary | ICD-10-CM

## 2021-03-16 DIAGNOSIS — Z1321 Encounter for screening for nutritional disorder: Secondary | ICD-10-CM

## 2021-03-16 DIAGNOSIS — Z1329 Encounter for screening for other suspected endocrine disorder: Secondary | ICD-10-CM | POA: Diagnosis not present

## 2021-03-16 DIAGNOSIS — Z01419 Encounter for gynecological examination (general) (routine) without abnormal findings: Secondary | ICD-10-CM | POA: Diagnosis not present

## 2021-03-16 DIAGNOSIS — R102 Pelvic and perineal pain: Secondary | ICD-10-CM

## 2021-03-16 DIAGNOSIS — Z131 Encounter for screening for diabetes mellitus: Secondary | ICD-10-CM

## 2021-03-16 DIAGNOSIS — Z113 Encounter for screening for infections with a predominantly sexual mode of transmission: Secondary | ICD-10-CM

## 2021-03-16 DIAGNOSIS — N946 Dysmenorrhea, unspecified: Secondary | ICD-10-CM

## 2021-03-16 NOTE — Patient Instructions (Signed)
Call [phone] to schedule your mammogram 

## 2021-03-16 NOTE — Progress Notes (Signed)
WELL-WOMAN EXAMINATION Patient name: Colleen Marshall MRN [id number]  Date of birth: Oct 10, 1980 Chief Complaint:   Gynecologic Exam  History of Present Illness:   Colleen Marshall is a 41 y.o. G0P0000 African-American female being seen today for a routine well-woman exam. Had Liletta IUD placed 03/27/18 for menorrhagia w/ regular cycle. Was having bothersome breakthrough bleeding at IUD f/u, so was rx'd megace, states it didn't help. And she read that it is used in HIV and cancer pt's and no longer wanted to take it. Currently having irregular but lighter periods, however they last longer ~7-14d, changes pad q 3-4hrs, +cramping/pain.  Is done with it, and doesn't want to bleed anymore. Has a new boyfriend and is going out of town in April, doesn't want to be bleeding when she goes. Some RLQ pain at times.   PCP: none      does desire labs including STI screening Patient's last menstrual period was 03/05/2021. The current method of family planning is IUD. Liletta inserted 03/27/18 Last pap 02/20/18. Results were: NILM w/ HRHPV negative. H/O abnormal pap: no Last mammogram: never. Results were: N/A. Family h/o breast cancer: yes MA Last colonoscopy: never. Results were: N/A. Family h/o colorectal cancer: no  Depression screen [facility] 2/9 03/16/2021 02/20/2018  Decreased Interest 0 0  Down, Depressed, Hopeless 0 0  PHQ - 2 Score 0 0  Altered sleeping 1 -  Tired, decreased energy 1 -  Change in appetite 0 -  Feeling bad or failure about yourself  0 -  Trouble concentrating 0 -  Moving slowly or fidgety/restless 0 -  Suicidal thoughts 0 -  PHQ-9 Score 2 -     GAD 7 : Generalized Anxiety Score 03/16/2021  Nervous, Anxious, on Edge 0  Control/stop worrying 0  Worry too much - different things 1  Trouble relaxing 0  Restless 0  Easily annoyed or irritable 0  Afraid - awful might happen 0  Total GAD 7 Score 1     Review of Systems:   Pertinent items are noted in HPI Denies any headaches, blurred  vision, fatigue, shortness of breath, chest pain, abdominal pain, abnormal vaginal discharge/itching/odor/irritation, problems with periods, bowel movements, urination, or intercourse unless otherwise stated above. Pertinent History Reviewed:  Reviewed past medical,surgical, social and family history.  Reviewed problem list, medications and allergies. Physical Assessment:   Vitals:   03/16/21 1333  BP: 137/87  Pulse: 77  Weight: 118 lb 8 oz (53.8 kg)  Height: 5\' 3"  (1.6 m)  Body mass index is 20.99 kg/m.        Physical Examination:   General appearance - well appearing, and in no distress  Mental status - alert, oriented to person, place, and time  Psych:  She has a normal mood and affect  Skin - warm and dry, normal color, no suspicious lesions noted  Chest - effort normal, all lung fields clear to auscultation bilaterally  Heart - normal rate and regular rhythm  Neck:  midline trachea, no thyromegaly or nodules  Breasts - breasts appear normal, no suspicious masses, no skin or nipple changes or  axillary nodes  Abdomen - soft, nontender, nondistended, no masses or organomegaly  Pelvic - VULVA: normal appearing vulva with no masses, tenderness or lesions  VAGINA: normal appearing vagina with normal color and discharge, no lesions  CERVIX: normal appearing cervix without discharge or lesions, no CMT[, IUD strings visible and appropriate length  Thin prep pap is done w/ HR HPV cotesting  UTERUS: uterus is felt to be normal size, shape, consistency and nontender   ADNEXA: No adnexal masses noted, some RLQ tenderness  Extremities:  No swelling or varicosities noted  Chaperone: Malachy Mood    No results found for this or any previous visit (from the past 24 hour(s)).  Assessment & Plan:  1) Well-Woman Exam  2) Irregular bleeding and dysmenorrhea w/ Liletta IUD> wants bleeding to stop completely. Megace didn't help in past and was not comfortable taking it. Will get pelvic u/s and  f/u w/ MD after  3) STD screen  Labs/procedures today: pap and labs  Mammogram: screening order placed, number given for pt to call and schedule appt Colonoscopy: @ 42yo, or sooner if problems  Orders Placed This Encounter  Procedures   MM 3D SCREEN BREAST BILATERAL   US PELVIS (TRANSABDOMINAL ONLY)   US PELVIS TRANSVAGINAL NON-OB (TV ONLY)   CBC   Comprehensive metabolic panel   TSH   Hemoglobin A1c   VITAMIN D 25 Hydroxy (Vit-D Deficiency, Fractures)   HIV Antibody (routine testing w rflx)   RPR    Meds: No orders of the defined types were placed in this encounter.   Follow-up: Return for 1st available, US:GYN and f/u w/ MD after.  Cheral Marker CNM, [facility] 03/16/2021 2:27 PM

## 2021-03-17 LAB — COMPREHENSIVE METABOLIC PANEL
ALT: 12 IU/L (ref 0–32)
AST: 21 IU/L (ref 0–40)
Albumin/Globulin Ratio: 1.7 (ref 1.2–2.2)
Albumin: 4.5 g/dL (ref 3.8–4.8)
Alkaline Phosphatase: 72 IU/L (ref 44–121)
BUN/Creatinine Ratio: 9 (ref 9–23)
BUN: 7 mg/dL (ref 6–24)
Bilirubin Total: 0.3 mg/dL (ref 0.0–1.2)
CO2: 24 mmol/L (ref 20–29)
Calcium: 9.4 mg/dL (ref 8.7–10.2)
Chloride: 103 mmol/L (ref 96–106)
Creatinine, Ser: 0.78 mg/dL (ref 0.57–1.00)
Globulin, Total: 2.7 g/dL (ref 1.5–4.5)
Glucose: 83 mg/dL (ref 70–99)
Potassium: 3.9 mmol/L (ref 3.5–5.2)
Sodium: 141 mmol/L (ref 134–144)
Total Protein: 7.2 g/dL (ref 6.0–8.5)
eGFR: 98 mL/min/{1.73_m2} (ref >59)

## 2021-03-17 LAB — CBC
Hematocrit: 40.3 % (ref 34.0–46.6)
Hemoglobin: 13.9 g/dL (ref 11.1–15.9)
MCH: 33.5 pg — ABNORMAL HIGH (ref 26.6–33.0)
MCHC: 34.5 g/dL (ref 31.5–35.7)
MCV: 97 fL (ref 79–97)
Platelets: 231 10*3/uL (ref 150–450)
RBC: 4.15 x10E6/uL (ref 3.77–5.28)
RDW: 12.4 % (ref 11.7–15.4)
WBC: 5.5 10*3/uL (ref 3.4–10.8)

## 2021-03-17 LAB — HIV ANTIBODY (ROUTINE TESTING W REFLEX): HIV Screen 4th Generation wRfx: NONREACTIVE

## 2021-03-17 LAB — HEMOGLOBIN A1C
Est. average glucose Bld gHb Est-mCnc: 108 mg/dL
Hgb A1c MFr Bld: 5.4 % (ref 4.8–5.6)

## 2021-03-17 LAB — TSH: TSH: 1.68 u[IU]/mL (ref 0.450–4.500)

## 2021-03-17 LAB — RPR: RPR Ser Ql: NONREACTIVE

## 2021-03-17 LAB — VITAMIN D 25 HYDROXY (VIT D DEFICIENCY, FRACTURES): Vit D, 25-Hydroxy: 42.6 ng/mL (ref 30.0–100.0)

## 2021-03-19 LAB — CYTOLOGY - PAP
Chlamydia: NEGATIVE
Comment: NEGATIVE
Comment: NEGATIVE
Comment: NORMAL
Diagnosis: NEGATIVE
High risk HPV: NEGATIVE
Neisseria Gonorrhea: NEGATIVE

## 2021-03-23 ENCOUNTER — Ambulatory Visit (HOSPITAL_COMMUNITY): Payer: BC Managed Care – PPO

## 2021-03-23 ENCOUNTER — Ambulatory Visit (HOSPITAL_COMMUNITY)
Admission: RE | Admit: 2021-03-23 | Discharge: 2021-03-23 | Disposition: A | Discharge status: Home / Self Care | Payer: BC Managed Care – PPO | Source: Ambulatory Visit | Attending: Women's Health | Admitting: Women's Health

## 2021-03-23 ENCOUNTER — Other Ambulatory Visit: Payer: Self-pay

## 2021-03-23 DIAGNOSIS — Z1231 Encounter for screening mammogram for malignant neoplasm of breast: Secondary | ICD-10-CM | POA: Diagnosis present

## 2021-03-30 ENCOUNTER — Other Ambulatory Visit: Payer: Self-pay

## 2021-03-30 ENCOUNTER — Encounter: Payer: Self-pay | Admitting: Obstetrics & Gynecology

## 2021-03-30 ENCOUNTER — Ambulatory Visit: Payer: BC Managed Care – PPO | Admitting: Obstetrics & Gynecology

## 2021-03-30 ENCOUNTER — Ambulatory Visit (INDEPENDENT_AMBULATORY_CARE_PROVIDER_SITE_OTHER): Payer: BC Managed Care – PPO

## 2021-03-30 VITALS — BP 138/84 | HR 75 | Ht 63.0 in | Wt 122.0 lb

## 2021-03-30 DIAGNOSIS — N926 Irregular menstruation, unspecified: Secondary | ICD-10-CM

## 2021-03-30 DIAGNOSIS — N938 Other specified abnormal uterine and vaginal bleeding: Secondary | ICD-10-CM | POA: Diagnosis not present

## 2021-03-30 DIAGNOSIS — R102 Pelvic and perineal pain: Secondary | ICD-10-CM

## 2021-03-30 DIAGNOSIS — N946 Dysmenorrhea, unspecified: Secondary | ICD-10-CM

## 2021-03-30 NOTE — Progress Notes (Unsigned)
Follow up appointment for results  Chief Complaint  Patient presents with   Follow-up    [place] today    Blood pressure 138/84, pulse 75, height 5\' 3"  (1.6 m), weight 122 lb (55.3 kg), last menstrual period 03/05/2021.  03/07/2021 PELVIS TRANSVAGINAL NON-OB (TV ONLY)  Result Date: 03/30/2021 Images from the original result were not included.  ..an 04/01/2021 of Ultrasound Medicine Financial trader) accredited practice Center for [facility] [address] [place] Ordering Provider: [id number], CNM                                                                                                            GYNECOLOGIC SONOGRAM Colleen Marshall is a 41 y.o. G0P0000 Patient's last menstrual period was 03/05/2021. She is here for a pelvic sonogram for irregular bleeding,dysmenorrhea, and RLQ pain Uterus                      4 x 3 x 4.2 cm, Total uterine volume 26 cc, homogeneous anteverted uterus,WNL Endometrium          1.6 mm, symmetrical, WNL,,IUD is centrally located within the endometrium Right ovary             2.7 x 1.8 x 2.7 cm, normal Left ovary                2.3 x 1.6 x 2.1 cm, normal No free fluid Technician Comments: PELVIC 03/07/2021 TA/TV: homogeneous anteverted uterus,WNL,EEC 1.6 mm,IUD is centrally located within the endometrium,normal ovaries,ovaries appear mobile,no free fluid,no pain during ultrasound Chaperone [address] 03/30/2021 12:25 PM Clinical Impression and recommendations: I have reviewed the sonogram results above, combined with the patient's current clinical course, below are my impressions and any appropriate recommendations for management based on the sonographic findings. Uterus normal size shape and contour Endometrium is thin due to IUD suppression IUD in proper location Ovaries: normal size shape and morphology 04/01/2021 03/30/2021 2:11 PM   04/01/2021 PELVIS (TRANSABDOMINAL ONLY)  Result Date: 03/30/2021 Images from the original result were not  included.  ..an 04/01/2021 of Ultrasound Medicine Financial trader) accredited practice Center for [facility] [address] [place] Ordering Provider: [id number], CNM                                                                                                            GYNECOLOGIC SONOGRAM Colleen Marshall is a 41 y.o. G0P0000 Patient's last menstrual period was 03/05/2021. She is here  for a pelvic sonogram for irregular bleeding,dysmenorrhea, and RLQ pain Uterus                      4 x 3 x 4.2 cm, Total uterine volume 26 cc, homogeneous anteverted uterus,WNL Endometrium          1.6 mm, symmetrical, WNL,,IUD is centrally located within the endometrium Right ovary             2.7 x 1.8 x 2.7 cm, normal Left ovary                2.3 x 1.6 x 2.1 cm, normal No free fluid Technician Comments: PELVIC US TA/TV: homogeneous anteverted uterus,WNL,EEC 1.6 mm,IUD is centrally located within the endometrium,normal ovaries,ovaries appear mobile,no free fluid,no pain during ultrasound Chaperone [address] Flora Lipps 03/30/2021 12:25 PM Clinical Impression and recommendations: I have reviewed the sonogram results above, combined with the patient's current clinical course, below are my impressions and any appropriate recommendations for management based on the sonographic findings. Uterus normal size shape and contour Endometrium is thin due to IUD suppression IUD in proper location Ovaries: normal size shape and morphology Lazaro Arms 03/30/2021 2:11 PM    Discussed options, decided to do IUD removal and an endometrial ablation  Will begin COC(nextstellis), ablation acheduled and patient will continue COC post op most likely  MEDS ordered this encounter: No orders of the defined types were placed in this encounter.   Orders for this encounter: No orders of the defined types were placed in this encounter.   Impression: DUB, chronic  Plan: ***  Follow Up: No  follow-ups on file.    {67124: 10 min     99213: 15 min      99214:25 min}   Face to face time:  *** minutes  Greater than 50% of the visit time was spent in counseling and coordination of care with the patient.  The summary and outline of the counseling and care coordination is summarized in the note above.   All questions were answered.  No past medical history on file.  Past Surgical History:  Procedure Laterality Date   EYE SURGERY     41yrs old    OB History     Gravida  0   Para  0   Term  0   Preterm  0   AB  0   Living  0      SAB  0   IAB  0   Ectopic  0   Multiple  0   Live Births  0           Allergies  Allergen Reactions   Latex Rash    Social History   Socioeconomic History   Marital status: Widowed    Spouse name: Not on file   Number of children: 0   Years of education: Not on file   Highest education level: Not on file  Occupational History   Not on file  Tobacco Use   Smoking status: Light Smoker    Types: Cigarettes   Smokeless tobacco: Never   Tobacco comments:    occ  Vaping Use   Vaping Use: Never used  Substance and Sexual Activity   Alcohol use: Yes    Comment: occasional   Drug use: Never   Sexual activity: Yes    Birth control/protection: I.U.D.  Other Topics Concern   Not on file  Social History Narrative  Not on file   Social Determinants of Health   Financial Resource Strain: Medium Risk   Difficulty of Paying Living Expenses: Somewhat hard  Food Insecurity: No Food Insecurity   Worried About Running Out of Food in the Last Year: Never true   Ran Out of Food in the Last Year: Never true  Transportation Needs: No Transportation Needs   Lack of Transportation (Medical): No   Lack of Transportation (Non-Medical): No  Physical Activity: Insufficiently Active   Days of Exercise per Week: 2 days   Minutes of Exercise per Session: 30 min  Stress: No Stress Concern Present   Feeling of Stress :  Not at all  Social Connections: Moderately Isolated   Frequency of Communication with Friends and Family: More than three times a week   Frequency of Social Gatherings with Friends and Family: More than three times a week   Attends Religious Services: 1 to 4 times per year   Active Member of [employer] or Organizations: No   Attends BankerClub or Organization Meetings: Never   Marital Status: Widowed    Family History  Problem Relation Age of Onset   Alzheimer's disease Paternal Grandmother    Hypertension Maternal Grandmother    Hypertension Maternal Grandfather    Hypertension Father    Hypertension Mother    Hypertension Sister

## 2021-03-30 NOTE — Progress Notes (Signed)
PELVIC US TA/TV: homogeneous anteverted uterus,WNL,EEC 1.6 mm,IUD is centrally located within the endometrium,normal ovaries,ovaries appear mobile,no free fluid,no pain during ultrasound  ?

## 2021-05-07 NOTE — Patient Instructions (Signed)
? ? ? ? ? ? ? ? Star Age ? 05/07/2021  ?  ? @PREFPERIOPPHARMACY @ ? ? Your procedure is scheduled on  05/13/2021. ? ? Report to 05/15/2021 at  0930  A.M. ? ? Call this number if you have problems the morning of surgery: ? [phone] ? ? Remember: ? Do not eat after midnight. ? ? You may drink clear liquids until 0650 am 05/13/2021 .   ? ?Clear liquids allowed are:                    Water, Juice (non-citric and without pulp - diabetics please choose diet or no sugar options), Carbonated beverages - (diabetics please choose diet or no sugar options), Clear Tea, Black Coffee only (no creamer, milk or cream including half and half), Plain Jell-O only (diabetics please choose diet or no sugar options), Gatorade (diabetics please choose diet or no sugar options), and Plain Popsicles only ?    ? ?  At 0650 on 05/13/2021, drink your carb drink. You can have nothing else to drink after this. ? ? ? Take these medicines the morning of surgery with A SIP OF WATER   ? ?                                            None ?  ? ? Do not wear jewelry, make-up or nail polish. ? Do not wear lotions, powders, or perfumes, or deodorant. ? Do not shave 48 hours prior to surgery.  Men may shave face and neck. ? Do not bring valuables to the hospital. ? [facility] is not responsible for any belongings or valuables. ? ?Contacts, dentures or bridgework may not be worn into surgery.  Leave your suitcase in the car.  After surgery it may be brought to your room. ? ?For patients admitted to the hospital, discharge time will be determined by your treatment team. ? ?Patients discharged the day of surgery will not be allowed to drive home and must have someone with them for 24 hours.  ? ? ?Special instructions:   DO NOT smoke tobacco or vape for 24 hours before your procedure. ? ?Please read over the following fact sheets that you were given. ?Coughing and Deep Breathing, Surgical Site Infection Prevention, Anesthesia Post-op Instructions, and  Care and Recovery After Surgery ?  ? ? ? Dilation and Curettage or Vacuum Curettage, Care After ?The following information offers guidance on how to care for yourself after your procedure. Your doctor may also give you more specific instructions. If you have problems or questions, contact your doctor. ?What can I expect after the procedure? ?After the procedure, it is common to have: ?Mild pain or cramps. ?Some bleeding or spotting from the vagina. ?These may last for up to 2 weeks. ?Follow these instructions at home: ?Medicines ?Take over-the-counter and prescription medicines only as told by your doctor. ?If told, take steps to prevent problems with pooping (constipation). You may need to: ?Drink enough fluid to keep your pee (urine) pale yellow. ?Take medicines. You will be told what medicines to take. ?Eat foods that are high in fiber. These include beans, whole grains, and fresh fruits and vegetables. ?Limit foods that are high in fat and sugar. These include fried or sweet foods. ?Ask your doctor if you should avoid driving or using machines while you are  taking your medicine. ?Activity ? ?If you were given a medicine to help you relax (sedative) during your procedure, it can affect you for many hours. Do not drive or use machinery until your doctor says that it is safe. ?Rest as told by your doctor. ?Get up to take short walks every 1-2 hours. Ask for help if you feel weak or unsteady. ?Do not lift anything that is heavier than 10 lb (4.5 kg), or the limit that you are told. ?Return to your normal activities when your doctor says that it is safe. ?Lifestyle ?For at least 2 weeks, or as long as told by your doctor: ?Do not douche. ?Do not use tampons. ?Do not have sex. ?General instructions ?Do not take baths, swim, or use a hot tub. Ask your doctor if you may take showers. ?Do not smoke or use any products that contain nicotine or tobacco. These can delay healing. If you need help quitting, ask your  doctor. ?Wear compression stockings as told by your doctor. ?It is up to you to get the results of your procedure. Ask how to get your results when they are ready. ?Keep all follow-up visits. ?Contact a doctor if: ?You have very bad cramps that get worse or do not get better with medicine. ?You have very bad pain in your belly (abdomen). ?You cannot drink fluids without vomiting. ?You have pain in the area just above your thighs. ?You have fluid from your vagina that smells bad. ?You have a rash. ?Get help right away if: ?You are bleeding a lot from your vagina. This means soaking more than one sanitary pad in 1 hour, and this happens for 2 hours in a row. ?You have a fever that is above 100.4?F (38?C). ?Your belly feels very tender or hard. ?You have chest pain. ?You have trouble breathing. ?You feel dizzy or light-headed. ?You faint. ?You have pain in your neck or shoulder area. ?These symptoms may be an emergency. Get help right away. Call your local emergency services (911 in the [place] ?Do not wait to see if the symptoms will go away. ?Do not drive yourself to the hospital. ?Summary ?After your procedure, it is common to have pain or cramping. It is also common to have bleeding or spotting from your vagina. ?Rest as told. Get up to take short walks every 1-2 hours. ?Do not lift anything that is heavier than 10 lb (4.5 kg), or the limit that you are told. ?Get help right away if you have problems from the procedure. Ask your doctor what problems to watch for. ?This information is not intended to replace advice given to you by your health care provider. Make sure you discuss any questions you have with your health care provider. ?Document Revised: 12/26/2019 Document Reviewed: 12/26/2019 ?Elsevier Patient Education ? 2023 [employer] ?General Anesthesia, Adult, Care After ?This sheet gives you information about how to care for yourself after your procedure. Your health care provider may also give you more  specific instructions. If you have problems or questions, contact your health care provider. ?What can I expect after the procedure? ?After the procedure, the following side effects are common: ?Pain or discomfort at the IV site. ?Nausea. ?Vomiting. ?Sore throat. ?Trouble concentrating. ?Feeling cold or chills. ?Feeling weak or tired. ?Sleepiness and fatigue. ?Soreness and body aches. These side effects can affect parts of the body that were not involved in surgery. ?Follow these instructions at home: ?For the time period you were told by your health care  provider: ? ?Rest. ?Do not participate in activities where you could fall or become injured. ?Do not drive or use machinery. ?Do not drink alcohol. ?Do not take sleeping pills or medicines that cause drowsiness. ?Do not make important decisions or sign legal documents. ?Do not take care of children on your own. ?Eating and drinking ?Follow any instructions from your health care provider about eating or drinking restrictions. ?When you feel hungry, start by eating small amounts of foods that are soft and easy to digest (bland), such as toast. Gradually return to your regular diet. ?Drink enough fluid to keep your urine pale yellow. ?If you vomit, rehydrate by drinking water, juice, or clear broth. ?General instructions ?If you have sleep apnea, surgery and certain medicines can increase your risk for breathing problems. Follow instructions from your health care provider about wearing your sleep device: ?Anytime you are sleeping, including during daytime naps. ?While taking prescription pain medicines, sleeping medicines, or medicines that make you drowsy. ?Have a responsible adult stay with you for the time you are told. It is important to have someone help care for you until you are awake and alert. ?Return to your normal activities as told by your health care provider. Ask your health care provider what activities are safe for you. ?Take over-the-counter and  prescription medicines only as told by your health care provider. ?If you smoke, do not smoke without supervision. ?Keep all follow-up visits as told by your health care provider. This is important. ?Contact a health c

## 2021-05-08 ENCOUNTER — Encounter (HOSPITAL_COMMUNITY)
Admission: RE | Admit: 2021-05-08 | Discharge: 2021-05-08 | Disposition: A | Discharge status: Home / Self Care | Payer: BC Managed Care – PPO | Source: Ambulatory Visit | Attending: Obstetrics & Gynecology | Admitting: Obstetrics & Gynecology

## 2021-05-08 ENCOUNTER — Other Ambulatory Visit: Payer: Self-pay | Admitting: Obstetrics & Gynecology

## 2021-05-08 ENCOUNTER — Encounter (HOSPITAL_COMMUNITY): Payer: Self-pay

## 2021-05-08 DIAGNOSIS — Z01812 Encounter for preprocedural laboratory examination: Secondary | ICD-10-CM | POA: Insufficient documentation

## 2021-05-08 DIAGNOSIS — Z01818 Encounter for other preprocedural examination: Secondary | ICD-10-CM

## 2021-05-08 DIAGNOSIS — Z114 Encounter for screening for human immunodeficiency virus [HIV]: Secondary | ICD-10-CM | POA: Insufficient documentation

## 2021-05-08 HISTORY — DX: Other specified health status: Z78.9

## 2021-05-08 LAB — CBC
HCT: 39.4 % (ref 36.0–46.0)
Hemoglobin: 13.4 g/dL (ref 12.0–15.0)
MCH: 33.3 pg (ref 26.0–34.0)
MCHC: 34 g/dL (ref 30.0–36.0)
MCV: 97.8 fL (ref 80.0–100.0)
Platelets: 252 10*3/uL (ref 150–400)
RBC: 4.03 MIL/uL (ref 3.87–5.11)
RDW: 12.9 % (ref 11.5–15.5)
WBC: 6.9 10*3/uL (ref 4.0–10.5)
nRBC: 0 % (ref 0.0–0.2)

## 2021-05-08 LAB — COMPREHENSIVE METABOLIC PANEL
ALT: 16 U/L (ref 0–44)
AST: 23 U/L (ref 15–41)
Albumin: 4.1 g/dL (ref 3.5–5.0)
Alkaline Phosphatase: 44 U/L (ref 38–126)
Anion gap: 7 (ref 5–15)
BUN: 13 mg/dL (ref 6–20)
CO2: 25 mmol/L (ref 22–32)
Calcium: 9 mg/dL (ref 8.9–10.3)
Chloride: 106 mmol/L (ref 98–111)
Creatinine, Ser: 0.84 mg/dL (ref 0.44–1.00)
GFR, Estimated: >60 mL/min (ref >60)
Glucose, Bld: 80 mg/dL (ref 70–99)
Potassium: 4.3 mmol/L (ref 3.5–5.1)
Sodium: 138 mmol/L (ref 135–145)
Total Bilirubin: 0.6 mg/dL (ref 0.3–1.2)
Total Protein: 7.7 g/dL (ref 6.5–8.1)

## 2021-05-08 LAB — RAPID HIV SCREEN (HIV 1/2 AB+AG)
HIV 1/2 Antibodies: NONREACTIVE
HIV-1 P24 Antigen - HIV24: NONREACTIVE

## 2021-05-08 LAB — URINALYSIS, ROUTINE W REFLEX MICROSCOPIC
Bacteria, UA: NONE SEEN
Bilirubin Urine: NEGATIVE
Glucose, UA: NEGATIVE mg/dL
Ketones, ur: NEGATIVE mg/dL
Leukocytes,Ua: NEGATIVE
Nitrite: NEGATIVE
Protein, ur: NEGATIVE mg/dL
Specific Gravity, Urine: 1.002 — ABNORMAL LOW (ref 1.005–1.030)
pH: 6 (ref 5.0–8.0)

## 2021-05-08 LAB — HCG, QUANTITATIVE, PREGNANCY: hCG, Beta Chain, Quant, S: <1 m[IU]/mL (ref <5)

## 2021-05-11 ENCOUNTER — Telehealth: Payer: Self-pay | Admitting: Obstetrics & Gynecology

## 2021-05-11 NOTE — Telephone Encounter (Signed)
Patient called and left a message about medicine that Dr. Elonda Husky gave her and she states that she is out and has a procedure coming up on wants to know if he could send in another script  ?

## 2021-05-12 ENCOUNTER — Other Ambulatory Visit: Payer: Self-pay | Admitting: Obstetrics & Gynecology

## 2021-05-12 MED ORDER — DROSPIRENONE-ESTETROL 3-14.2 MG PO TABS: 1.0000 | ORAL_TABLET | Freq: Every day | ORAL | 12 refills | Status: DC

## 2021-05-12 NOTE — Telephone Encounter (Signed)
Called pt to inform her of prescription sent to pharmacy. ?

## 2021-05-12 NOTE — Telephone Encounter (Signed)
Rx e prescribed: nextstellis

## 2021-05-12 NOTE — Telephone Encounter (Signed)
Called pt to relay Dr Forestine Chute advice on medication. Pt confirmed understanding. ?

## 2021-05-12 NOTE — Telephone Encounter (Signed)
Returned pt's call, two identifiers used. Pt explained that she was given some samples of Nextellis. She has 4 pills left after today and was told to continue those after her ablation. She needed a prescription to be sent in. Told pt her msg would be sent to Dr Despina Hidden to address. Pt confirmed understanding. Pt's call transferred to front to reschedule her post-op appt. ?

## 2021-05-13 ENCOUNTER — Other Ambulatory Visit: Payer: Self-pay

## 2021-05-13 ENCOUNTER — Ambulatory Visit (HOSPITAL_COMMUNITY): Payer: BC Managed Care – PPO | Admitting: Anesthesiology

## 2021-05-13 ENCOUNTER — Encounter (HOSPITAL_COMMUNITY)
Admission: RE | Disposition: A | Discharge status: Home / Self Care | Payer: Self-pay | Source: Home / Self Care | Attending: Obstetrics & Gynecology

## 2021-05-13 ENCOUNTER — Encounter (HOSPITAL_COMMUNITY): Payer: Self-pay | Admitting: Obstetrics & Gynecology

## 2021-05-13 ENCOUNTER — Ambulatory Visit (HOSPITAL_COMMUNITY)
Admission: RE | Admit: 2021-05-13 | Discharge: 2021-05-13 | Disposition: A | Discharge status: Home / Self Care | Payer: BC Managed Care – PPO | Attending: Obstetrics & Gynecology | Admitting: Obstetrics & Gynecology

## 2021-05-13 DIAGNOSIS — N946 Dysmenorrhea, unspecified: Secondary | ICD-10-CM

## 2021-05-13 DIAGNOSIS — F17210 Nicotine dependence, cigarettes, uncomplicated: Secondary | ICD-10-CM | POA: Diagnosis not present

## 2021-05-13 DIAGNOSIS — Z975 Presence of (intrauterine) contraceptive device: Secondary | ICD-10-CM | POA: Insufficient documentation

## 2021-05-13 DIAGNOSIS — N920 Excessive and frequent menstruation with regular cycle: Secondary | ICD-10-CM | POA: Diagnosis not present

## 2021-05-13 HISTORY — PX: DILATATION AND CURETTAGE/HYSTEROSCOPY WITH MINERVA: SHX6851

## 2021-05-13 SURGERY — DILATATION AND CURETTAGE/HYSTEROSCOPY WITH MINERVA
Anesthesia: General | Site: Vagina

## 2021-05-13 MED ORDER — 0.9 % SODIUM CHLORIDE (POUR BTL) OPTIME
TOPICAL | Status: DC | PRN
  Administered 2021-05-13: 1000 mL

## 2021-05-13 MED ORDER — HYDROCODONE-ACETAMINOPHEN 5-325 MG PO TABS: 1.0000 | ORAL_TABLET | Freq: Four times a day (QID) | ORAL | 0 refills | Status: DC | PRN

## 2021-05-13 MED ORDER — LIDOCAINE HCL (CARDIAC) PF 50 MG/5ML IV SOSY
PREFILLED_SYRINGE | INTRAVENOUS | Status: DC | PRN
  Administered 2021-05-13: 50 mg via INTRAVENOUS

## 2021-05-13 MED ORDER — PROPOFOL 10 MG/ML IV BOLUS
INTRAVENOUS | Status: DC | PRN
  Administered 2021-05-13: 160 mg via INTRAVENOUS

## 2021-05-13 MED ORDER — PROPOFOL 10 MG/ML IV BOLUS
INTRAVENOUS | Status: AC
  Filled 2021-05-13: qty 20

## 2021-05-13 MED ORDER — ONDANSETRON HCL 8 MG PO TABS: 8.0000 mg | ORAL_TABLET | Freq: Three times a day (TID) | ORAL | 0 refills | Status: DC | PRN

## 2021-05-13 MED ORDER — CEFAZOLIN SODIUM-DEXTROSE 2-4 GM/100ML-% IV SOLN
2.0000 g | INTRAVENOUS | Status: AC
  Administered 2021-05-13: 2 g via INTRAVENOUS
  Filled 2021-05-13: qty 100

## 2021-05-13 MED ORDER — ORAL CARE MOUTH RINSE: 15.0000 mL | Freq: Once | OROMUCOSAL | Status: AC

## 2021-05-13 MED ORDER — CHLORHEXIDINE GLUCONATE 0.12 % MT SOLN
15.0000 mL | Freq: Once | OROMUCOSAL | Status: AC
  Administered 2021-05-13: 15 mL via OROMUCOSAL

## 2021-05-13 MED ORDER — KETOROLAC TROMETHAMINE 10 MG PO TABS: 10.0000 mg | ORAL_TABLET | Freq: Three times a day (TID) | ORAL | 0 refills | Status: DC | PRN

## 2021-05-13 MED ORDER — POVIDONE-IODINE 10 % EX SWAB: 2.0000 "application " | Freq: Once | CUTANEOUS | Status: DC

## 2021-05-13 MED ORDER — MEPERIDINE HCL 50 MG/ML IJ SOLN: 6.2500 mg | INTRAMUSCULAR | Status: DC | PRN

## 2021-05-13 MED ORDER — ONDANSETRON HCL 4 MG/2ML IJ SOLN
INTRAMUSCULAR | Status: AC
Start: 2021-05-13 — End: ?
  Filled 2021-05-13: qty 2

## 2021-05-13 MED ORDER — SODIUM CHLORIDE 0.9 % IR SOLN
Status: DC | PRN
  Administered 2021-05-13: 3000 mL

## 2021-05-13 MED ORDER — LIDOCAINE HCL (PF) 2 % IJ SOLN
INTRAMUSCULAR | Status: AC
  Filled 2021-05-13: qty 5

## 2021-05-13 MED ORDER — LACTATED RINGERS IV SOLN
INTRAVENOUS | Status: DC
  Administered 2021-05-13: 1000 mL via INTRAVENOUS

## 2021-05-13 MED ORDER — FENTANYL CITRATE (PF) 100 MCG/2ML IJ SOLN
INTRAMUSCULAR | Status: AC
  Filled 2021-05-13: qty 2

## 2021-05-13 MED ORDER — MIDAZOLAM HCL 2 MG/2ML IJ SOLN
INTRAMUSCULAR | Status: AC
  Filled 2021-05-13: qty 2

## 2021-05-13 MED ORDER — KETOROLAC TROMETHAMINE 30 MG/ML IJ SOLN
30.0000 mg | Freq: Once | INTRAMUSCULAR | Status: AC
  Administered 2021-05-13: 30 mg via INTRAVENOUS
  Filled 2021-05-13: qty 1

## 2021-05-13 MED ORDER — MIDAZOLAM HCL 5 MG/5ML IJ SOLN
INTRAMUSCULAR | Status: DC | PRN
  Administered 2021-05-13: 2 mg via INTRAVENOUS

## 2021-05-13 MED ORDER — FENTANYL CITRATE (PF) 100 MCG/2ML IJ SOLN
INTRAMUSCULAR | Status: DC | PRN
  Administered 2021-05-13: 50 ug via INTRAVENOUS

## 2021-05-13 MED ORDER — HYDROMORPHONE HCL 1 MG/ML IJ SOLN: 0.2500 mg | INTRAMUSCULAR | Status: DC | PRN

## 2021-05-13 MED ORDER — ONDANSETRON HCL 4 MG/2ML IJ SOLN: 4.0000 mg | Freq: Once | INTRAMUSCULAR | Status: DC | PRN

## 2021-05-13 MED ORDER — ONDANSETRON HCL 4 MG/2ML IJ SOLN
INTRAMUSCULAR | Status: DC | PRN
  Administered 2021-05-13: 4 mg via INTRAVENOUS

## 2021-05-13 MED ORDER — DEXAMETHASONE SODIUM PHOSPHATE 10 MG/ML IJ SOLN
INTRAMUSCULAR | Status: DC | PRN
  Administered 2021-05-13: 8 mg via INTRAVENOUS

## 2021-05-13 SURGICAL SUPPLY — 29 items
BAG HAMPER (MISCELLANEOUS) ×2 IMPLANT
CLOTH BEACON ORANGE TIMEOUT ST (SAFETY) ×2 IMPLANT
COVER LIGHT HANDLE STERIS (MISCELLANEOUS) ×4 IMPLANT
GAUZE 4X4 16PLY ~~LOC~~+RFID DBL (SPONGE) ×4 IMPLANT
GLOVE BIOGEL PI IND STRL 7.0 (GLOVE) ×2 IMPLANT
GLOVE BIOGEL PI IND STRL 8 (GLOVE) ×1 IMPLANT
GLOVE BIOGEL PI INDICATOR 7.0 (GLOVE) ×2
GLOVE BIOGEL PI INDICATOR 8 (GLOVE) ×1
GLOVE ECLIPSE 8.0 STRL XLNG CF (GLOVE) ×4 IMPLANT
GLOVE SRG 8 PF TXTR STRL LF DI (GLOVE) ×1 IMPLANT
GLOVE SURG UNDER POLY LF SZ8 (GLOVE) ×2
GOWN STRL REUS W/TWL LRG LVL3 (GOWN DISPOSABLE) ×2 IMPLANT
GOWN STRL REUS W/TWL XL LVL3 (GOWN DISPOSABLE) ×2 IMPLANT
HANDPIECE ABLA MINERVA ENDO (MISCELLANEOUS) ×2 IMPLANT
INST SET HYSTEROSCOPY (KITS) ×2 IMPLANT
IV NS 1000ML (IV SOLUTION) ×2
IV NS 1000ML BAXH (IV SOLUTION) ×1 IMPLANT
KIT TURNOVER CYSTO (KITS) ×2 IMPLANT
MANIFOLD NEPTUNE II (INSTRUMENTS) ×2 IMPLANT
MARKER SKIN DUAL TIP RULER LAB (MISCELLANEOUS) ×2 IMPLANT
NS IRRIG 1000ML POUR BTL (IV SOLUTION) ×2 IMPLANT
PACK BASIC III (CUSTOM PROCEDURE TRAY) ×2
PACK SRG BSC III STRL LF ECLPS (CUSTOM PROCEDURE TRAY) ×1 IMPLANT
PAD ARMBOARD 7.5X6 YLW CONV (MISCELLANEOUS) ×2 IMPLANT
PAD TELFA 3X4 1S STER (GAUZE/BANDAGES/DRESSINGS) ×2 IMPLANT
SET BASIN LINEN APH (SET/KITS/TRAYS/PACK) ×2 IMPLANT
SET CYSTO W/LG BORE CLAMP LF (SET/KITS/TRAYS/PACK) ×2 IMPLANT
SHEET LAVH (DRAPES) ×2 IMPLANT
TUBE CONNECTING 12X1/4 (SUCTIONS) ×2 IMPLANT

## 2021-05-13 NOTE — Anesthesia Procedure Notes (Signed)
Procedure Name: LMA Insertion ?Date/Time: 05/13/2021 1:06 AM ?Performed by: Moshe Salisbury, CRNA ?Pre-anesthesia Checklist: Patient identified, Patient being monitored, Emergency Drugs available, Timeout performed and Suction available ?Patient Re-evaluated:Patient Re-evaluated prior to induction ?Oxygen Delivery Method: Circle System Utilized ?Preoxygenation: Pre-oxygenation with 100% oxygen ?Induction Type: IV induction ?Ventilation: Mask ventilation without difficulty ?LMA: LMA inserted ?LMA Size: 3.0 ?Number of attempts: 1 ?Placement Confirmation: positive ETCO2 and breath sounds checked- equal and bilateral ? ? ? ? ?

## 2021-05-13 NOTE — Anesthesia Preprocedure Evaluation (Signed)
Anesthesia Evaluation  ?Patient identified by MRN, date of birth, ID band ?Patient awake ? ? ? ?Reviewed: ?Allergy & Precautions, NPO status , Patient's Chart, lab work & pertinent test results ? ?Airway ?Mallampati: II ? ?TM Distance: >3 FB ?Neck ROM: Full ? ? ? Dental ? ?(+) Dental Advisory Given, Teeth Intact ?  ?Pulmonary ?Current SmokerPatient did not abstain from smoking.,  ?  ?Pulmonary exam normal ?breath sounds clear to auscultation ? ? ? ? ? ? Cardiovascular ?negative cardio ROS ?Normal cardiovascular exam ?Rhythm:Regular Rate:Normal ? ? ?  ?Neuro/Psych ?negative neurological ROS ? negative psych ROS  ? GI/Hepatic ?negative GI ROS, Neg liver ROS,   ?Endo/Other  ?negative endocrine ROS ? Renal/GU ?negative Renal ROS  ?negative genitourinary ?  ?Musculoskeletal ?negative musculoskeletal ROS ?(+)  ? Abdominal ?  ?Peds ?negative pediatric ROS ?(+)  Hematology ?negative hematology ROS ?(+)   ?Anesthesia Other Findings ? ? Reproductive/Obstetrics ?negative OB ROS ? ?  ? ? ? ? ? ? ? ? ? ? ? ? ? ?  ?  ? ? ? ? ? ? ? ? ?Anesthesia Physical ?Anesthesia Plan ? ?ASA: 2 ? ?Anesthesia Plan: General  ? ?Post-op Pain Management: Dilaudid IV  ? ?Induction: Intravenous ? ?PONV Risk Score and Plan: 4 or greater and Ondansetron, Dexamethasone and Midazolam ? ?Airway Management Planned: LMA ? ?Additional Equipment:  ? ?Intra-op Plan:  ? ?Post-operative Plan: Extubation in OR ? ?Informed Consent: I have reviewed the patients History and Physical, chart, labs and discussed the procedure including the risks, benefits and alternatives for the proposed anesthesia with the patient or authorized representative who has indicated his/her understanding and acceptance.  ? ? ? ?Dental advisory given ? ?Plan Discussed with: CRNA and Surgeon ? ?Anesthesia Plan Comments:   ? ? ? ? ? ? ?Anesthesia Quick Evaluation ? ?

## 2021-05-13 NOTE — Transfer of Care (Signed)
Immediate Anesthesia Transfer of Care Note ? ?Patient: Colleen Marshall ? ?Procedure(s) Performed: DILATATION AND CURETTAGE/HYSTEROSCOPY WITH MINERVA (Vagina ) ? ?Patient Location: Short Stay ? ?Anesthesia Type:General ? ?Level of Consciousness: awake ? ?Airway & Oxygen Therapy: Patient Spontanous Breathing ? ?Post-op Assessment: Report given to RN ? ?Post vital signs: Reviewed and stable ? ?Last Vitals:  ?Vitals Value Taken Time  ?BP 113/80 05/13/21 1140  ?Temp    ?Pulse 82 05/13/21 1143  ?Resp 15 05/13/21 1143  ?SpO2 100 % 05/13/21 1143  ?Vitals shown include unvalidated device data. ? ?Last Pain:  ?Vitals:  ? 05/13/21 0955  ?TempSrc: Oral  ?PainSc: 0-No pain  ?   ? ?Patients Stated Pain Goal: 6 (05/13/21 0955) ? ?Complications: No notable events documented. ?

## 2021-05-13 NOTE — Interval H&P Note (Signed)
History and Physical Interval Note: ? ?05/13/2021 ?10:40 AM ? ?Colleen Marshall  has presented today for surgery, with the diagnosis of DUB x 30 years.  The various methods of treatment have been discussed with the patient and family. After consideration of risks, benefits and other options for treatment, the patient has consented to  Procedure(s): ?DILATATION AND CURETTAGE/HYSTEROSCOPY WITH MINERVA (N/A) as a surgical intervention.  The patient's history has been reviewed, patient examined, no change in status, stable for surgery.  I have reviewed the patient's chart and labs.  Questions were answered to the patient's satisfaction.   ? ? ?[place] ? ? ?

## 2021-05-13 NOTE — Op Note (Signed)
Preoperative diagnosis:  1.   Menorrhagia ?                                        2.  Dysmenorrhea ?                                        3.  Unresponsive to conservative measures  ? ?Postoperative diagnoses: Same as above  ? ?Procedure: Hysteroscopy, endometrial ablation using Minerva ? ?Surgeon: Florian Buff  ? ?Anesthesia: Laryngeal mask airway ? ?Findings: The endometrium was normal. There were no fibroid or other abnormalities. ? ?Description of operation: The patient was taken to the operating room and placed in the supine position. She underwent general anesthesia using the laryngeal mask airway. She was placed in the dorsal lithotomy position and prepped and draped in the usual sterile fashion. A Graves speculum was placed and the anterior cervical lip was grasped with a single-tooth tenaculum. The cervix was dilated serially to allow passage of the hysteroscope. Diagnostic hysteroscopy was performed and was found to be normal. The endometrium was thin on sonogram and confirmed today. ? ?I then proceeded to perform the Minerva endometrial ablation.   ?The uterus sounded to 7.5 cm ?The handpiece was attached to the Minerva power source/machine and the handpiece passed the checklist. ?The array was squeezed down to remove all of the air present.  ?The array was then place into the endometrial cavity and deployed to a length of 4.5 cm. ?The handpiece confirmed appropriate width by being in the green portion of the visual dial. ?The cervical cuff was then inflated to the point the CO2 indicator was in the green. ?The endometrial integrity check was then performed and integrity sequence was confirmed x 2. ?The heating was then begun and carried out for a total of 2 minutes(which is standard therapy time). ?When the plasma cycle was finished,  the cervical cuff was deflated and the array was removed with tissue present on the silicon membrane. ?There was appropriate post Minerva bleeding and uterine  discharge. ? ? ?  All of the equipment worked well throughout the procedure.  The patient was awakened from anesthesia and taken to the recovery room in good stable condition all counts were correct. She received 2 g of Ancef and 30 mg of Toradol preoperatively. She will be discharged from the recovery room and followed up in the office in 1- 2 weeks.  ? ?She can expect 4 weeks of post procedure bloody watery discharge ? ?Florian Buff, MD  ?05/13/2021 ?11:30 AM ? ? ?

## 2021-05-13 NOTE — H&P (Signed)
Preoperative History and Physical ? ?Star AgeKeysha C Lizak is a 41 y.o. G0P0000 with No LMP recorded. (Menstrual status: IUD). admitted for a hysteroscopy uterine curettage Minerva endometrial ablation for menorrhagia dysmenorrhea unresponsive to IUD and megestrol therapy ?Sonogram is normal.  ? ?Had Liletta IUD placed 03/27/18 for menorrhagia w/ regular cycle. Was having bothersome breakthrough bleeding at IUD f/u, so was rx'd megace, states it didn't help. And she read that it is used in HIV and cancer pt's and no longer wanted to take it. Currently having irregular but lighter periods, however they last longer ~7-14d, changes pad q 3-4hrs, +cramping/pain.  Is done with it, and doesn't want to bleed anymore. Has a new boyfriend and is going out of town in April, doesn't want to be bleeding when she goes. Some RLQ pain at times.  ? ? ?PMH:    ?Past Medical History:  ?Diagnosis Date  ? Medical history non-contributory   ? ? ?PSH:     ?Past Surgical History:  ?Procedure Laterality Date  ? EYE SURGERY    ? [age]yrs old  ? ? ?POb/GynH:      ?OB History   ? ? Gravida  ?0  ? Para  ?0  ? Term  ?0  ? Preterm  ?0  ? AB  ?0  ? Living  ?0  ?  ? ? SAB  ?0  ? IAB  ?0  ? Ectopic  ?0  ? Multiple  ?0  ? Live Births  ?0  ?   ?  ?  ? ? ?SH:   ?Social History  ? ?Tobacco Use  ? Smoking status: Light Smoker  ?  Types: Cigarettes  ? Smokeless tobacco: Never  ? Tobacco comments:  ?  occ  ?Vaping Use  ? Vaping Use: Never used  ?Substance Use Topics  ? Alcohol use: Yes  ?  Comment: occasional  ? Drug use: Never  ? ? ?FH:    ?Family History  ?Problem Relation Age of Onset  ? Alzheimer's disease Paternal Grandmother   ? Hypertension Maternal Grandmother   ? Hypertension Maternal Grandfather   ? Hypertension Father   ? Hypertension Mother   ? Hypertension Sister   ? ? ? ?Allergies:  ?Allergies  ?Allergen Reactions  ? Latex Rash  ? ? ?Medications:      No current facility-administered medications for this encounter. ? ?Current Outpatient Medications:  ?   loratadine (CLARITIN) 10 MG tablet, Take 10 mg by mouth daily as needed for allergies., Disp: , Rfl:  ?  Multiple Vitamins-Minerals (MULTIVITAMIN WITH MINERALS) tablet, Take 1 tablet by mouth daily., Disp: , Rfl:  ?  Drospirenone-Estetrol 3-14.2 MG TABS, Take 1 tablet by mouth daily., Disp: 28 tablet, Rfl: 12 ? ?Review of Systems:  ? ?Review of Systems  ?Constitutional: Negative for fever, chills, weight loss, malaise/fatigue and diaphoresis.  ?HENT: Negative for hearing loss, ear pain, nosebleeds, congestion, sore throat, neck pain, tinnitus and ear discharge.   ?Eyes: Negative for blurred vision, double vision, photophobia, pain, discharge and redness.  ?Respiratory: Negative for cough, hemoptysis, sputum production, shortness of breath, wheezing and stridor.   ?Cardiovascular: Negative for chest pain, palpitations, orthopnea, claudication, leg swelling and PND.  ?Gastrointestinal: Positive for abdominal pain. Negative for heartburn, nausea, vomiting, diarrhea, constipation, blood in stool and melena.  ?Genitourinary: Negative for dysuria, urgency, frequency, hematuria and flank pain.  ?Musculoskeletal: Negative for myalgias, back pain, joint pain and falls.  ?Skin: Negative for itching and rash.  ?Neurological: Negative for dizziness,  tingling, tremors, sensory change, speech change, focal weakness, seizures, loss of consciousness, weakness and headaches.  ?Endo/Heme/Allergies: Negative for environmental allergies and polydipsia. Does not bruise/bleed easily.  ?Psychiatric/Behavioral: Negative for depression, suicidal ideas, hallucinations, memory loss and substance abuse. The patient is not nervous/anxious and does not have insomnia.   ? ? ? ?PHYSICAL EXAM: ? ?There were no vitals taken for this visit. ? ?  ?Vitals reviewed. ?Constitutional: She is oriented to person, place, and time. She appears well-developed and well-nourished.  ?HENT:  ?Head: Normocephalic and atraumatic.  ?Right Ear: External ear normal.   ?Left Ear: External ear normal.  ?Nose: Nose normal.  ?Mouth/Throat: Oropharynx is clear and moist.  ?Eyes: Conjunctivae and EOM are normal. Pupils are equal, round, and reactive to light. Right eye exhibits no discharge. Left eye exhibits no discharge. No scleral icterus.  ?Neck: Normal range of motion. Neck supple. No tracheal deviation present. No thyromegaly present.  ?Cardiovascular: Normal rate, regular rhythm, normal heart sounds and intact distal pulses.  Exam reveals no gallop and no friction rub.   ?No murmur heard. ?Respiratory: Effort normal and breath sounds normal. No respiratory distress. She has no wheezes. She has no rales. She exhibits no tenderness.  ?GI: Soft. Bowel sounds are normal. She exhibits no distension and no mass. There is tenderness. There is no rebound and no guarding.  ?Genitourinary:  ?     Vulva is normal without lesions ?Vagina is pink moist without discharge ?Cervix normal in appearance and pap is normal ?Uterus is normal size, contour, position, consistency, mobility, non-tender ?Adnexa is negative with normal sized ovaries by sonogram  ?Musculoskeletal: Normal range of motion. She exhibits no edema and no tenderness.  ?Neurological: She is alert and oriented to person, place, and time. She has normal reflexes. She displays normal reflexes. No cranial nerve deficit. She exhibits normal muscle tone. Coordination normal.  ?Skin: Skin is warm and dry. No rash noted. No erythema. No pallor.  ?Psychiatric: She has a normal mood and affect. Her behavior is normal. Judgment and thought content normal.  ? ? ?Labs: ?Results for orders placed or performed during the hospital encounter of 05/08/21 (from the past 336 hour(s))  ?CBC  ? Collection Time: 05/08/21  8:35 AM  ?Result Value Ref Range  ? WBC 6.9 4.0 - 10.5 K/uL  ? RBC 4.03 3.87 - 5.11 MIL/uL  ? Hemoglobin 13.4 12.0 - 15.0 g/dL  ? HCT 39.4 36.0 - 46.0 %  ? MCV 97.8 80.0 - 100.0 fL  ? MCH 33.3 26.0 - 34.0 pg  ? MCHC 34.0 30.0 - 36.0  g/dL  ? RDW 12.9 11.5 - 15.5 %  ? Platelets 252 150 - 400 K/uL  ? nRBC 0.0 0.0 - 0.2 %  ?Comprehensive metabolic panel  ? Collection Time: 05/08/21  8:35 AM  ?Result Value Ref Range  ? Sodium 138 135 - 145 mmol/L  ? Potassium 4.3 3.5 - 5.1 mmol/L  ? Chloride 106 98 - 111 mmol/L  ? CO2 25 22 - 32 mmol/L  ? Glucose, Bld 80 70 - 99 mg/dL  ? BUN 13 6 - 20 mg/dL  ? Creatinine, Ser 0.84 0.44 - 1.00 mg/dL  ? Calcium 9.0 8.9 - 10.3 mg/dL  ? Total Protein 7.7 6.5 - 8.1 g/dL  ? Albumin 4.1 3.5 - 5.0 g/dL  ? AST 23 15 - 41 U/L  ? ALT 16 0 - 44 U/L  ? Alkaline Phosphatase 44 38 - 126 U/L  ? Total Bilirubin 0.6  0.3 - 1.2 mg/dL  ? GFR, Estimated >60 >60 mL/min  ? Anion gap 7 5 - 15  ?Rapid HIV screen (HIV 1/2 Ab+Ag)  ? Collection Time: 05/08/21  8:35 AM  ?Result Value Ref Range  ? HIV-1 P24 Antigen - HIV24 NON REACTIVE NON REACTIVE  ? HIV 1/2 Antibodies NON REACTIVE NON REACTIVE  ? Interpretation (HIV Ag Ab)    ?  A non reactive test result means that HIV 1 or HIV 2 antibodies and HIV 1 p24 antigen were not detected in the specimen.  ?Urinalysis, Routine w reflex microscopic Urine, Clean Catch  ? Collection Time: 05/08/21  8:35 AM  ?Result Value Ref Range  ? Color, Urine COLORLESS (A) YELLOW  ? APPearance CLEAR CLEAR  ? Specific Gravity, Urine 1.002 (L) 1.005 - 1.030  ? pH 6.0 5.0 - 8.0  ? Glucose, UA NEGATIVE NEGATIVE mg/dL  ? Hgb urine dipstick SMALL (A) NEGATIVE  ? Bilirubin Urine NEGATIVE NEGATIVE  ? Ketones, ur NEGATIVE NEGATIVE mg/dL  ? Protein, ur NEGATIVE NEGATIVE mg/dL  ? Nitrite NEGATIVE NEGATIVE  ? Leukocytes,Ua NEGATIVE NEGATIVE  ? RBC / HPF 0-5 0 - 5 RBC/hpf  ? WBC, UA 0-5 0 - 5 WBC/hpf  ? Bacteria, UA NONE SEEN NONE SEEN  ? Squamous Epithelial / LPF 0-5 0 - 5  ?hCG, quantitative, pregnancy  ? Collection Time: 05/08/21  8:35 AM  ?Result Value Ref Range  ? hCG, Beta Chain, Quant, S <1 <5 mIU/mL  ? ? ?EKG: ?No orders found for this or any previous visit. ? ?Imaging Studies: ?No results  found. ? ? ? ?Assessment: ?Menorrhagia ?Dysmenorrhea ?Unresponsive to conservative measures ? ?Plan: ?Hysteroscopy uterine curettage Minerva endometrial ablation ? ?Amaryllis Dyke Levoy Geisen ?05/13/2021 ?8:25 AM ? ? ? ? ? ? ? ?

## 2021-05-13 NOTE — Anesthesia Postprocedure Evaluation (Signed)
Anesthesia Post Note ? ?Patient: Colleen Marshall ? ?Procedure(s) Performed: DILATATION AND CURETTAGE/HYSTEROSCOPY WITH MINERVA (Vagina ) ? ?Patient location during evaluation: Phase II ?Anesthesia Type: General ?Level of consciousness: awake and alert and oriented ?Pain management: pain level controlled ?Vital Signs Assessment: post-procedure vital signs reviewed and stable ?Respiratory status: spontaneous breathing, nonlabored ventilation and respiratory function stable ?Cardiovascular status: blood pressure returned to baseline and stable ?Postop Assessment: no apparent nausea or vomiting ?Anesthetic complications: no ? ? ?No notable events documented. ? ? ?Last Vitals:  ?Vitals:  ? 05/13/21 1140 05/13/21 1209  ?BP: 113/80 (!) 142/77  ?Pulse: 75 73  ?Resp: 15 17  ?Temp: 36.6 ?C 36.5 ?C  ?SpO2: 100% 100%  ?  ?Last Pain:  ?Vitals:  ? 05/13/21 1209  ?TempSrc: Oral  ?PainSc: 0-No pain  ? ? ?  ?  ?  ?  ?  ?  ? ?Colleen Marshall ? ? ? ? ?

## 2021-05-14 ENCOUNTER — Encounter (HOSPITAL_COMMUNITY): Payer: Self-pay | Admitting: Obstetrics & Gynecology

## 2021-05-21 ENCOUNTER — Encounter: Payer: BC Managed Care – PPO | Admitting: Obstetrics & Gynecology

## 2021-05-22 ENCOUNTER — Encounter: Payer: BC Managed Care – PPO | Admitting: Obstetrics & Gynecology

## 2021-05-25 ENCOUNTER — Ambulatory Visit (INDEPENDENT_AMBULATORY_CARE_PROVIDER_SITE_OTHER): Payer: BC Managed Care – PPO | Admitting: Obstetrics & Gynecology

## 2021-05-25 ENCOUNTER — Encounter: Payer: Self-pay | Admitting: Obstetrics & Gynecology

## 2021-05-25 VITALS — BP 124/79 | HR 45 | Wt 118.0 lb

## 2021-05-25 DIAGNOSIS — Z9889 Other specified postprocedural states: Secondary | ICD-10-CM

## 2021-05-25 NOTE — Progress Notes (Signed)
?  HPI: ?Patient returns for routine postoperative follow-up having undergone hysteroscopyuterine curettage Minerva ablation on 05/13/21.  ?The patient's immediate postoperative recovery has been unremarkable. ?Since hospital discharge the patient reports no problems. ? ? ?Current Outpatient Medications: ?Drospirenone-Estetrol 3-14.2 MG TABS, Take 1 tablet by mouth daily., Disp: 28 tablet, Rfl: 12 ?loratadine (CLARITIN) 10 MG tablet, Take 10 mg by mouth daily as needed for allergies., Disp: , Rfl:  ?Multiple Vitamins-Minerals (MULTIVITAMIN WITH MINERALS) tablet, Take 1 tablet by mouth daily., Disp: , Rfl:  ?HYDROcodone-acetaminophen (NORCO/VICODIN) 5-325 MG tablet, Take 1 tablet by mouth every 6 (six) hours as needed. (Patient not taking: Reported on 05/25/2021), Disp: 10 tablet, Rfl: 0 ?ketorolac (TORADOL) 10 MG tablet, Take 1 tablet (10 mg total) by mouth every 8 (eight) hours as needed. (Patient not taking: Reported on 05/25/2021), Disp: 15 tablet, Rfl: 0 ?ondansetron (ZOFRAN) 8 MG tablet, Take 1 tablet (8 mg total) by mouth every 8 (eight) hours as needed for nausea. (Patient not taking: Reported on 05/25/2021), Disp: 12 tablet, Rfl: 0 ? ?No current facility-administered medications for this visit. ? ? ? ?Blood pressure 124/79, pulse (!) 45, weight 118 lb (53.5 kg), last menstrual period 05/10/2021. ? ?Physical Exam: ?Normal post ablation exam minimal blood tinged watery discharge ?Non tender uterus ? ?Diagnostic Tests: ? ? ?Pathology: ?benign ? ?Impression + Management plan: ?  ICD-10-CM   ?1. Post-operative state, endometrial ablation  Z98.890   ?  ? ? ? ? ? ?Medications Prescribed this encounter: ?No orders of the defined types were placed in this encounter. ? ? ? ? ?Follow up: ?Return if symptoms worsen or fail to improve. ? ? ? ?Lazaro Arms, MD ?Attending Physician for the [facility] and ?[facility] ?05/25/2021 ?9:24 AM ? ? ? ? ?

## 2022-03-16 IMAGING — MG MM DIGITAL SCREENING BILAT W/ TOMO AND CAD
8 series · 9 of 24 positions shown · non-contrast
Comparison: None.

CLINICAL DATA: Screening.

EXAM:
DIGITAL SCREENING BILATERAL MAMMOGRAM WITH TOMOSYNTHESIS AND CAD
TECHNIQUE: Bilateral screening digital craniocaudal and mediolateral oblique
mammograms were obtained. Bilateral screening digital breast
tomosynthesis was performed. The images were evaluated with
computer-aided detection.

[L CC synth-2D]
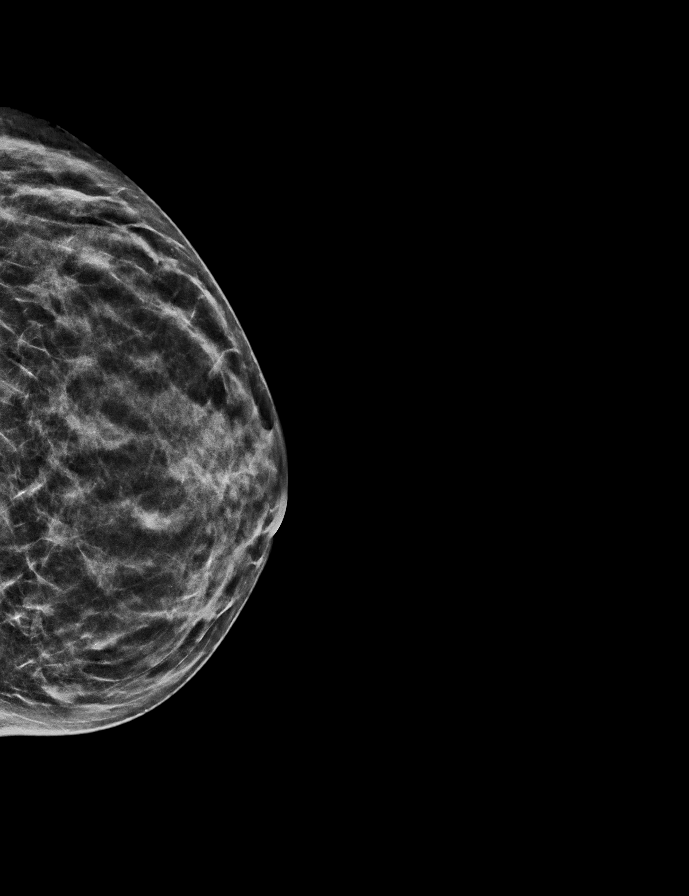

[L MLO synth-2D]
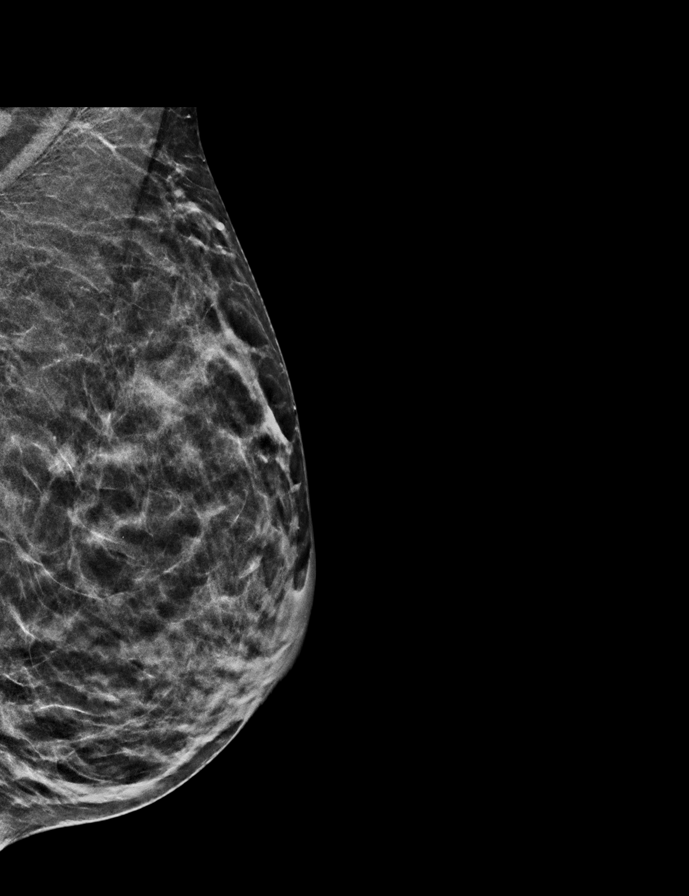

[R CC synth-2D]
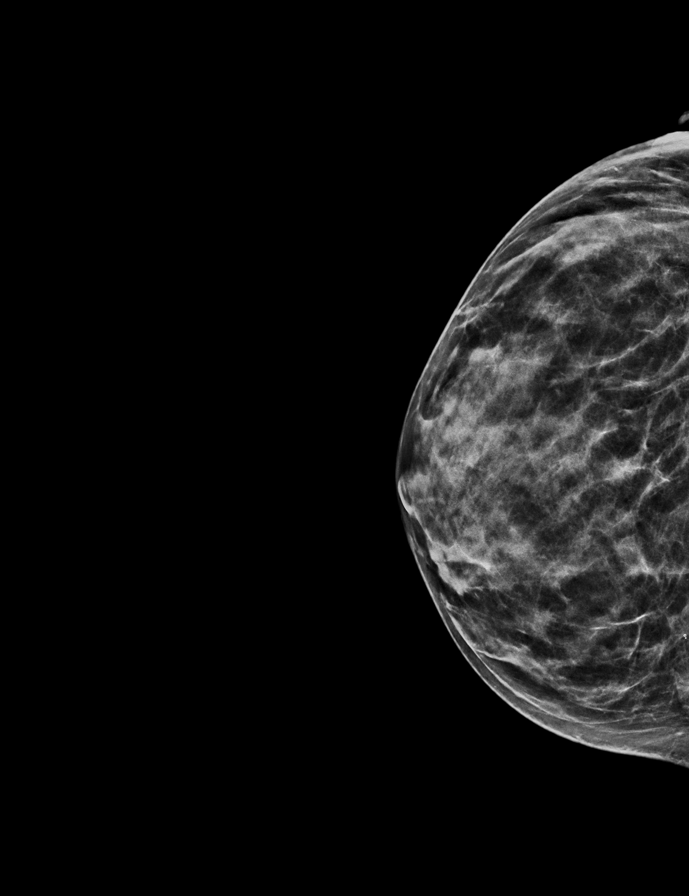

[R MLO synth-2D]
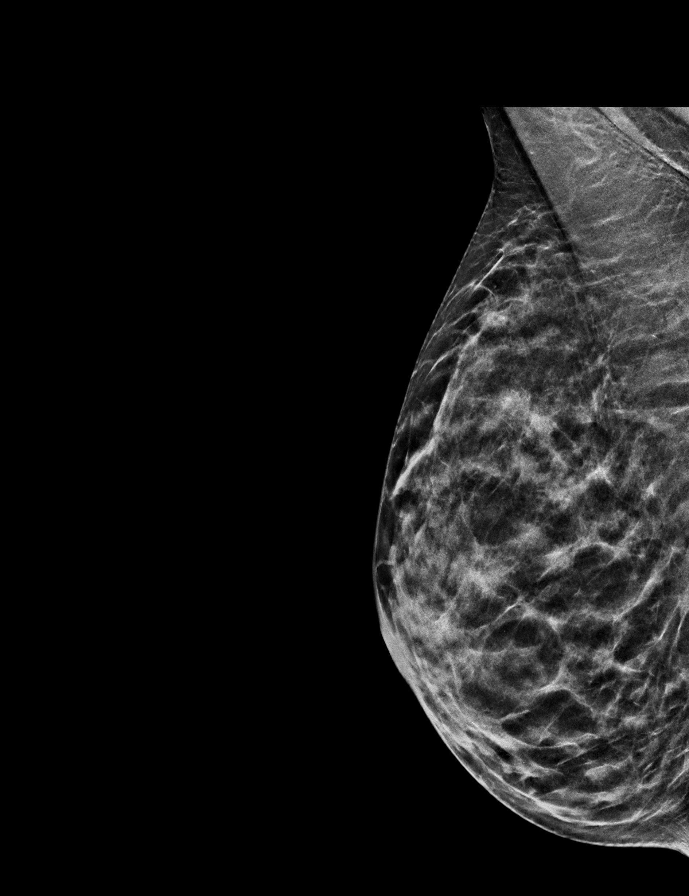

[L CC tomo · 2 of 45 frames shown]
[frame 15/45]
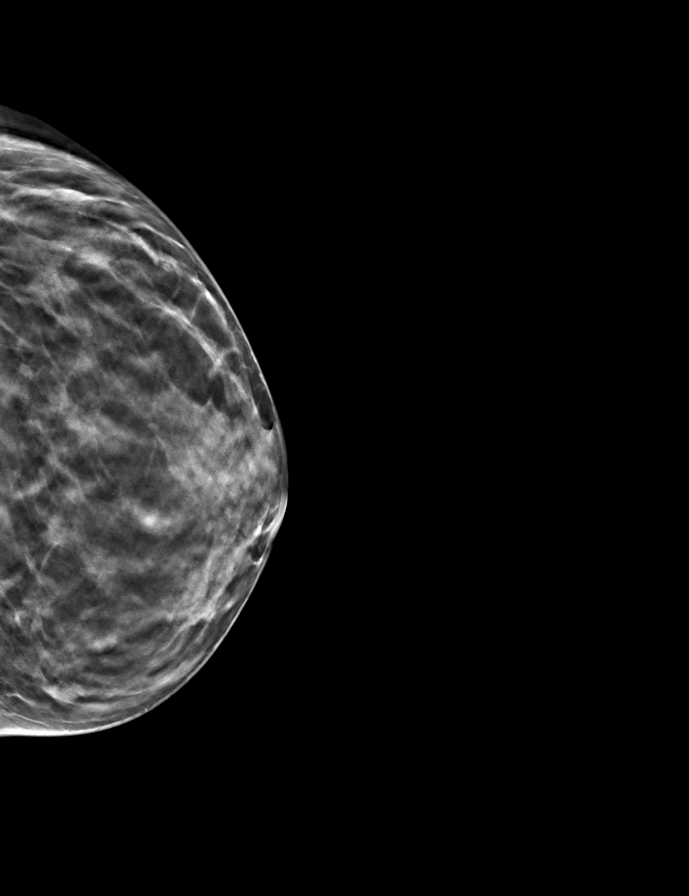
[frame 23/45]
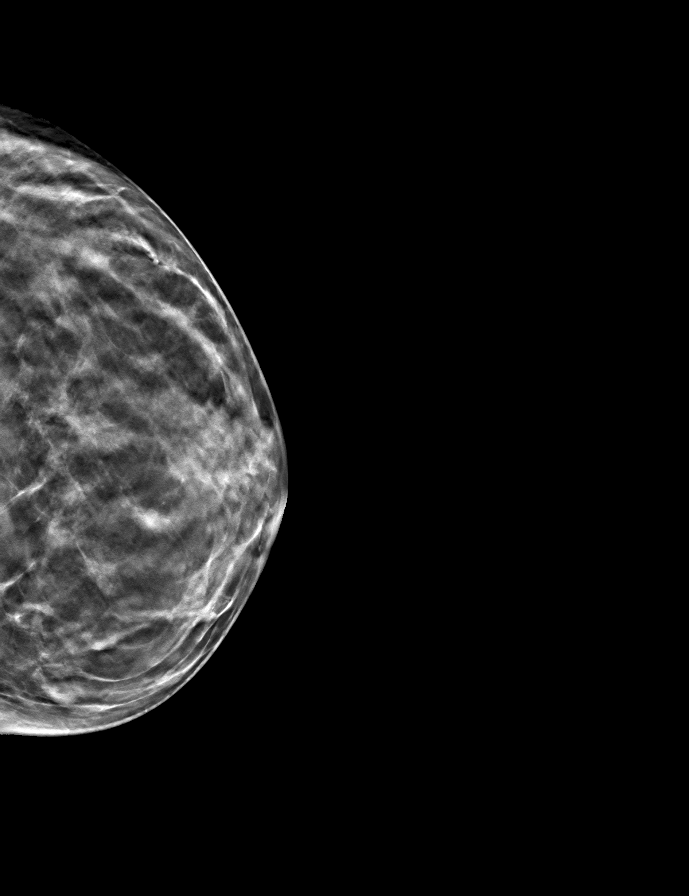

[R CC tomo · tomo slice 23/46.0]
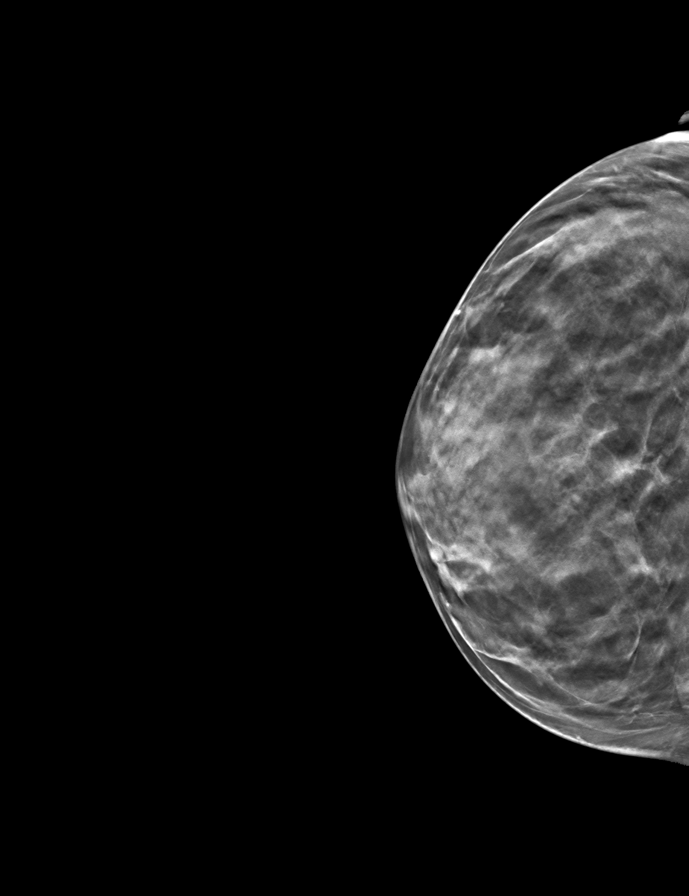

[L MLO tomo · tomo slice 21/42.0]
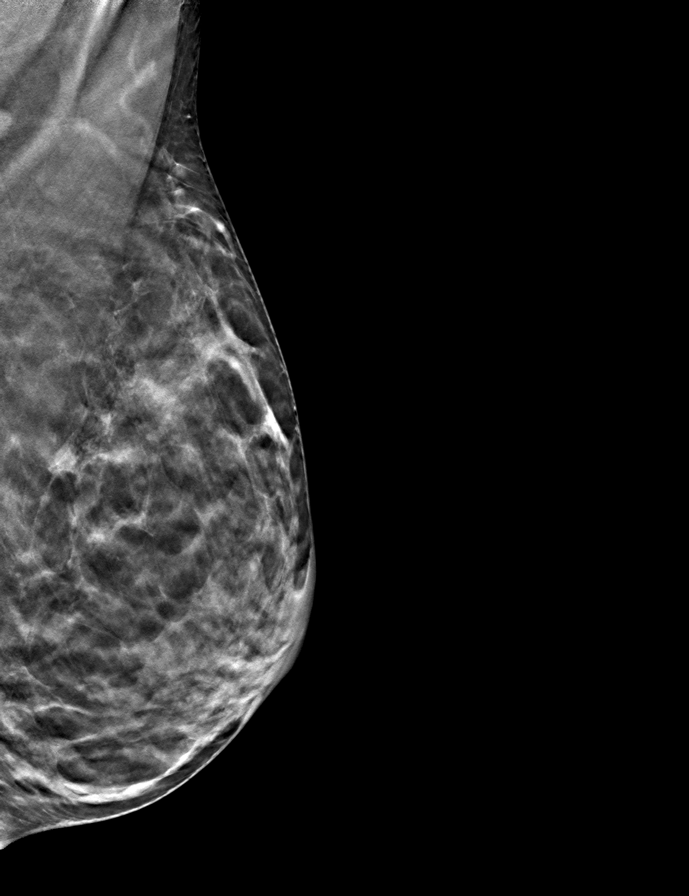

[R MLO tomo · tomo slice 22/43.0]
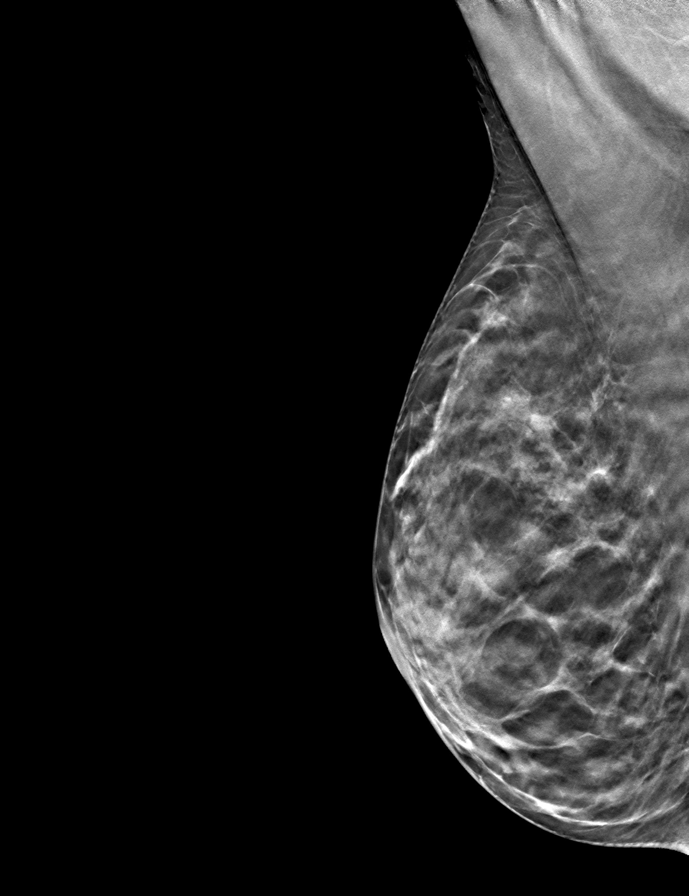

[9 of 24 positions shown; findings below may reference images not displayed]

ACR Breast Density Category c: The breast tissue is heterogeneously
dense, which may obscure small masses
FINDINGS: There are no findings suspicious for malignancy.
IMPRESSION: No mammographic evidence of malignancy. A result letter of this
screening mammogram will be mailed directly to the patient.

RECOMMENDATION:
Screening mammogram in one year. (Code:C8-T-HNK)

BI-RADS CATEGORY  1: Negative.

## 2022-03-23 ENCOUNTER — Other Ambulatory Visit: Payer: Self-pay | Admitting: Obstetrics & Gynecology

## 2023-01-22 ENCOUNTER — Other Ambulatory Visit: Payer: Self-pay | Admitting: Obstetrics & Gynecology

## 2023-04-14 ENCOUNTER — Telehealth: Payer: Self-pay | Admitting: *Deleted

## 2023-04-14 ENCOUNTER — Other Ambulatory Visit: Payer: Self-pay | Admitting: Obstetrics & Gynecology

## 2023-04-14 MED ORDER — DROSPIRENONE-ESTETROL 3-14.2 MG PO TABS: 1.0000 | ORAL_TABLET | Freq: Every day | ORAL | 12 refills | Status: DC

## 2023-04-14 NOTE — Telephone Encounter (Signed)
 Pharmacy called to request that a new prescription be sent in for nexstellis because patient says she was told to skip inactive pills. They need instructions to state that she is to take continuously skipping inactive pills.

## 2023-10-24 ENCOUNTER — Telehealth: Payer: Self-pay | Admitting: Obstetrics & Gynecology

## 2023-10-24 MED ORDER — DROSPIRENONE-ESTETROL 3-14.2 MG PO TABS: ORAL_TABLET | ORAL | Status: DC

## 2023-10-24 NOTE — Addendum Note (Signed)
 Addended by: ILEAN RUTHERFORD HERO on: 10/24/2023 02:23 PM   Modules accepted: Orders

## 2023-10-24 NOTE — Telephone Encounter (Signed)
 Pt states she is in need of samples for Nexstellis or a cheaper form of medication. Pt is without insurance at the time. Please advise

## 2023-10-27 ENCOUNTER — Other Ambulatory Visit: Payer: Self-pay | Admitting: Nurse Practitioner

## 2023-10-27 DIAGNOSIS — Z1231 Encounter for screening mammogram for malignant neoplasm of breast: Secondary | ICD-10-CM

## 2023-11-16 ENCOUNTER — Ambulatory Visit (HOSPITAL_COMMUNITY)
Admission: RE | Admit: 2023-11-16 | Discharge: 2023-11-16 | Disposition: A | Discharge status: Home / Self Care | Payer: Self-pay | Source: Ambulatory Visit | Attending: Nurse Practitioner | Admitting: Nurse Practitioner

## 2023-11-16 ENCOUNTER — Encounter (HOSPITAL_COMMUNITY): Payer: Self-pay

## 2023-11-16 DIAGNOSIS — Z1231 Encounter for screening mammogram for malignant neoplasm of breast: Secondary | ICD-10-CM | POA: Insufficient documentation

## 2023-11-30 ENCOUNTER — Other Ambulatory Visit (HOSPITAL_COMMUNITY): Payer: Self-pay | Admitting: Obstetrics and Gynecology

## 2023-11-30 DIAGNOSIS — R928 Other abnormal and inconclusive findings on diagnostic imaging of breast: Secondary | ICD-10-CM

## 2024-01-03 ENCOUNTER — Ambulatory Visit: Payer: Self-pay

## 2024-01-03 VITALS — BP 159/75 | Ht 63.0 in | Wt 125.0 lb

## 2024-01-03 DIAGNOSIS — Z1239 Encounter for other screening for malignant neoplasm of breast: Secondary | ICD-10-CM

## 2024-01-03 NOTE — Progress Notes (Signed)
 [name] is a 43 y.o. female who presents to [facility] clinic today with no complaints. Patient referred to [facility] due to having a screening mammogram completed 11/18/2023 that additional imaging of the left breast is recommended for follow up.   Pap Smear: Pap smear not completed today. Last Pap smear was 03/16/2021 at [facility] clinic and was normal with negative HPV. Per patient has history of an abnormal Pap smear in 2009 that was ASCUS that a colposcopy was completed for follow up 12/06/2007 that was benign. Patient stated all Pap smears have been normal since colposcopy and that she has had more than three normal Pap smears. Last Pap smear result is available in Epic.   Physical exam: Breasts Breasts symmetrical. No skin abnormalities bilateral breasts. No nipple retraction bilateral breasts. No nipple discharge bilateral breasts. No lymphadenopathy. No lumps palpated bilateral breasts. No complaints of pain or tenderness on exam.      MS 3D SCR MAMMO BILAT BR (aka MM) Result Date: 11/18/2023 CLINICAL DATA:  Screening. EXAM: DIGITAL SCREENING BILATERAL MAMMOGRAM WITH TOMOSYNTHESIS AND CAD TECHNIQUE: Bilateral screening digital craniocaudal and mediolateral oblique mammograms were obtained. Bilateral screening digital breast tomosynthesis was performed. The images were evaluated with computer-aided detection. COMPARISON:  Previous exam(s). ACR Breast Density Category c: The breasts are heterogeneously dense, which may obscure small masses. FINDINGS: In the left breast, a possible asymmetry warrants further evaluation. In the right breast, no findings suspicious for malignancy. IMPRESSION: Further evaluation is suggested for possible asymmetry in the left breast. RECOMMENDATION: Diagnostic mammogram and possibly ultrasound of the left breast. (Code:FI-L-18M) The patient will be contacted regarding the findings, and additional imaging will be scheduled. BI-RADS CATEGORY  0:  Incomplete: Need additional imaging evaluation. Electronically Signed   By: Colleen  Marshall M.D.   On: 11/18/2023 20:42   MM 3D SCREEN BREAST BILATERAL Result Date: 03/23/2021 CLINICAL DATA:  Screening. EXAM: DIGITAL SCREENING BILATERAL MAMMOGRAM WITH TOMOSYNTHESIS AND CAD TECHNIQUE: Bilateral screening digital craniocaudal and mediolateral oblique mammograms were obtained. Bilateral screening digital breast tomosynthesis was performed. The images were evaluated with computer-aided detection. COMPARISON:  None. ACR Breast Density Category c: The breast tissue is heterogeneously dense, which may obscure small masses FINDINGS: There are no findings suspicious for malignancy. IMPRESSION: No mammographic evidence of malignancy. A result letter of this screening mammogram will be mailed directly to the patient. RECOMMENDATION: Screening mammogram in one year. (Code:[id number]-01Y) BI-RADS CATEGORY  1: Negative. Electronically Signed   By: Colleen Marshall M.D.   On: 03/23/2021 09:38   Pelvic/Bimanual Pap is not indicated today per [facility] guidelines.   Smoking History: Patient is a current smoker. Discussed smoking cessation with patient. Referred to the [facility] Quitline.   Patient Navigation: Patient education provided. Access to services provided for patient through [facility] program.    Breast and Cervical Cancer Risk Assessment: Patient has family history of a maternal aunt having breast cancer. Patient has no known genetic mutations or history of radiation treatment to the chest before age 51. Patient has history of cervical dysplasia. Patient has no history of being immunocompromised or DES exposure in-utero.  Risk Scores as of Encounter on 01/03/2024     [name]           5-year 0.82%   Lifetime 9.35%            Last calculated by Colleen Marshall, CMA on 01/03/2024 at  8:33 AM        A: [facility] exam without pap  smear No complaints.  P: Referred patient to [facility] for a left breast diagnostic  mammogram per recommendation. Appointment scheduled Tuesday, January 10, 2024 at 1540.  Colleen Wanda SQUIBB, RN 01/03/2024 9:04 AM

## 2024-01-03 NOTE — Patient Instructions (Signed)
 Explained breast self awareness with Colleen Marshall. Patient did not need a Pap smear today due to last Pap smear and HPV typing was 03/16/2021. Let her know [facility] will cover Pap smears and HPV typing every 5 years unless has a history of abnormal Pap smears. Referred patient to [facility] for a left breast diagnostic mammogram per recommendation. Appointment scheduled Tuesday, January 10, 2024 at 1540. Patient aware of appointment and will be there. Colleen Marshall verbalized understanding.  Colleen Marshall, Colleen Ship, RN 9:04 AM

## 2024-01-10 ENCOUNTER — Ambulatory Visit (HOSPITAL_COMMUNITY)
Admission: RE | Admit: 2024-01-10 | Discharge: 2024-01-10 | Disposition: A | Discharge status: Home / Self Care | Payer: Self-pay | Source: Ambulatory Visit | Attending: Obstetrics and Gynecology | Admitting: Obstetrics and Gynecology

## 2024-01-10 DIAGNOSIS — R928 Other abnormal and inconclusive findings on diagnostic imaging of breast: Secondary | ICD-10-CM
